# Patient Record
Sex: Male | Born: 1995 | Race: Black or African American | Hispanic: No | Marital: Single | Smoking: Former smoker
Health system: Southern US, Community
[De-identification: ages and names within clinical notes are randomized; demographics above are authoritative.]

---

## 2016-12-30 ENCOUNTER — Other Ambulatory Visit: Payer: Self-pay | Admitting: Family

## 2016-12-30 ENCOUNTER — Inpatient Hospital Stay (HOSPITAL_COMMUNITY)
Admission: AD | Admit: 2016-12-30 | Discharge: 2017-01-03 | DRG: 885 | Disposition: A | Discharge status: Home / Self Care | Payer: No Typology Code available for payment source | Source: Intra-hospital | Attending: Psychiatry | Admitting: Psychiatry

## 2016-12-30 ENCOUNTER — Emergency Department (HOSPITAL_COMMUNITY)
Admission: EM | Admit: 2016-12-30 | Discharge: 2016-12-30 | Disposition: A | Discharge status: Other Acute Inpatient Hospital | Payer: No Typology Code available for payment source | Attending: Emergency Medicine | Admitting: Emergency Medicine

## 2016-12-30 ENCOUNTER — Encounter (HOSPITAL_COMMUNITY): Payer: Self-pay | Admitting: *Deleted

## 2016-12-30 ENCOUNTER — Emergency Department (HOSPITAL_COMMUNITY): Payer: No Typology Code available for payment source

## 2016-12-30 ENCOUNTER — Encounter (HOSPITAL_COMMUNITY): Payer: Self-pay | Admitting: Emergency Medicine

## 2016-12-30 DIAGNOSIS — Y9389 Activity, other specified: Secondary | ICD-10-CM | POA: Insufficient documentation

## 2016-12-30 DIAGNOSIS — F29 Unspecified psychosis not due to a substance or known physiological condition: Secondary | ICD-10-CM | POA: Diagnosis present

## 2016-12-30 DIAGNOSIS — Z5181 Encounter for therapeutic drug level monitoring: Secondary | ICD-10-CM | POA: Diagnosis not present

## 2016-12-30 DIAGNOSIS — T1491XA Suicide attempt, initial encounter: Secondary | ICD-10-CM | POA: Insufficient documentation

## 2016-12-30 DIAGNOSIS — F1610 Hallucinogen abuse, uncomplicated: Secondary | ICD-10-CM | POA: Diagnosis not present

## 2016-12-30 DIAGNOSIS — Y929 Unspecified place or not applicable: Secondary | ICD-10-CM | POA: Diagnosis not present

## 2016-12-30 DIAGNOSIS — F321 Major depressive disorder, single episode, moderate: Secondary | ICD-10-CM | POA: Diagnosis not present

## 2016-12-30 DIAGNOSIS — S1191XA Laceration without foreign body of unspecified part of neck, initial encounter: Secondary | ICD-10-CM | POA: Insufficient documentation

## 2016-12-30 DIAGNOSIS — X781XXA Intentional self-harm by knife, initial encounter: Secondary | ICD-10-CM | POA: Diagnosis not present

## 2016-12-30 DIAGNOSIS — R443 Hallucinations, unspecified: Secondary | ICD-10-CM | POA: Diagnosis not present

## 2016-12-30 DIAGNOSIS — F323 Major depressive disorder, single episode, severe with psychotic features: Principal | ICD-10-CM | POA: Diagnosis present

## 2016-12-30 DIAGNOSIS — F1910 Other psychoactive substance abuse, uncomplicated: Secondary | ICD-10-CM | POA: Diagnosis not present

## 2016-12-30 DIAGNOSIS — Y999 Unspecified external cause status: Secondary | ICD-10-CM | POA: Diagnosis not present

## 2016-12-30 DIAGNOSIS — S41112A Laceration without foreign body of left upper arm, initial encounter: Secondary | ICD-10-CM | POA: Insufficient documentation

## 2016-12-30 DIAGNOSIS — F17210 Nicotine dependence, cigarettes, uncomplicated: Secondary | ICD-10-CM | POA: Diagnosis not present

## 2016-12-30 DIAGNOSIS — F1290 Cannabis use, unspecified, uncomplicated: Secondary | ICD-10-CM | POA: Diagnosis not present

## 2016-12-30 DIAGNOSIS — F17200 Nicotine dependence, unspecified, uncomplicated: Secondary | ICD-10-CM | POA: Insufficient documentation

## 2016-12-30 DIAGNOSIS — F329 Major depressive disorder, single episode, unspecified: Secondary | ICD-10-CM | POA: Diagnosis not present

## 2016-12-30 LAB — CBC
HEMATOCRIT: 41.7 % (ref 39.0–52.0)
HEMOGLOBIN: 13.8 g/dL (ref 13.0–17.0)
MCH: 29.9 pg (ref 26.0–34.0)
MCHC: 33.1 g/dL (ref 30.0–36.0)
MCV: 90.3 fL (ref 78.0–100.0)
Platelets: 231 10*3/uL (ref 150–400)
RBC: 4.62 MIL/uL (ref 4.22–5.81)
RDW: 12.5 % (ref 11.5–15.5)
WBC: 11 10*3/uL — ABNORMAL HIGH (ref 4.0–10.5)

## 2016-12-30 LAB — COMPREHENSIVE METABOLIC PANEL
ALK PHOS: 50 U/L (ref 38–126)
ALT: 29 U/L (ref 17–63)
AST: 34 U/L (ref 15–41)
Albumin: 4.1 g/dL (ref 3.5–5.0)
Anion gap: 9 (ref 5–15)
BILIRUBIN TOTAL: 0.6 mg/dL (ref 0.3–1.2)
BUN: 12 mg/dL (ref 6–20)
CO2: 23 mmol/L (ref 22–32)
Calcium: 9.2 mg/dL (ref 8.9–10.3)
Chloride: 103 mmol/L (ref 101–111)
Creatinine, Ser: 1.34 mg/dL — ABNORMAL HIGH (ref 0.61–1.24)
GFR calc Af Amer: >60 mL/min (ref >60)
GLUCOSE: 132 mg/dL — AB (ref 65–99)
POTASSIUM: 4.1 mmol/L (ref 3.5–5.1)
Sodium: 135 mmol/L (ref 135–145)
Total Protein: 6.8 g/dL (ref 6.5–8.1)

## 2016-12-30 LAB — BPAM RBC
Blood Product Expiration Date: 201807082359
Blood Product Expiration Date: 201807222359
ISSUE DATE / TIME: 201806290627
ISSUE DATE / TIME: 201806290627
UNIT TYPE AND RH: 9500
Unit Type and Rh: 9500

## 2016-12-30 LAB — BPAM FFP
BLOOD PRODUCT EXPIRATION DATE: 201807122359
BLOOD PRODUCT EXPIRATION DATE: 201807192359
ISSUE DATE / TIME: 201806290627
ISSUE DATE / TIME: 201806290627
UNIT TYPE AND RH: 600
Unit Type and Rh: 600

## 2016-12-30 LAB — I-STAT CHEM 8, ED
BUN: 16 mg/dL (ref 6–20)
CREATININE: 1.2 mg/dL (ref 0.61–1.24)
Calcium, Ion: 1.08 mmol/L — ABNORMAL LOW (ref 1.15–1.40)
Chloride: 102 mmol/L (ref 101–111)
Glucose, Bld: 131 mg/dL — ABNORMAL HIGH (ref 65–99)
HEMATOCRIT: 41 % (ref 39.0–52.0)
Hemoglobin: 13.9 g/dL (ref 13.0–17.0)
Potassium: 4.1 mmol/L (ref 3.5–5.1)
Sodium: 139 mmol/L (ref 135–145)
TCO2: 23 mmol/L (ref 0–100)

## 2016-12-30 LAB — TYPE AND SCREEN
ABO/RH(D): A POS
Antibody Screen: NEGATIVE
UNIT DIVISION: 0
Unit division: 0

## 2016-12-30 LAB — I-STAT CG4 LACTIC ACID, ED: Lactic Acid, Venous: 1.14 mmol/L (ref 0.5–1.9)

## 2016-12-30 LAB — PREPARE FRESH FROZEN PLASMA
UNIT DIVISION: 0
Unit division: 0

## 2016-12-30 LAB — URINALYSIS, ROUTINE W REFLEX MICROSCOPIC
GLUCOSE, UA: NEGATIVE mg/dL
Hgb urine dipstick: NEGATIVE
Ketones, ur: 5 mg/dL — AB
LEUKOCYTES UA: NEGATIVE
Nitrite: NEGATIVE
PH: 6 (ref 5.0–8.0)
PROTEIN: NEGATIVE mg/dL
SPECIFIC GRAVITY, URINE: 1.03 (ref 1.005–1.030)

## 2016-12-30 LAB — ABO/RH: ABO/RH(D): A POS

## 2016-12-30 LAB — RAPID URINE DRUG SCREEN, HOSP PERFORMED
AMPHETAMINES: NOT DETECTED
BENZODIAZEPINES: NOT DETECTED
Barbiturates: NOT DETECTED
COCAINE: NOT DETECTED
OPIATES: NOT DETECTED
Tetrahydrocannabinol: POSITIVE — AB

## 2016-12-30 LAB — PROTIME-INR
INR: 1.03
Prothrombin Time: 13.5 seconds (ref 11.4–15.2)

## 2016-12-30 LAB — ETHANOL: Alcohol, Ethyl (B): <5 mg/dL (ref <5)

## 2016-12-30 LAB — ACETAMINOPHEN LEVEL: Acetaminophen (Tylenol), Serum: <10 ug/mL — ABNORMAL LOW (ref 10–30)

## 2016-12-30 LAB — SALICYLATE LEVEL

## 2016-12-30 LAB — CDS SEROLOGY

## 2016-12-30 MED ORDER — LORAZEPAM 2 MG/ML IJ SOLN
0.5000 mg | Freq: Once | INTRAMUSCULAR | Status: AC
  Administered 2016-12-30: 0.5 mg via INTRAVENOUS
  Filled 2016-12-30: qty 1

## 2016-12-30 MED ORDER — ALUM & MAG HYDROXIDE-SIMETH 200-200-20 MG/5ML PO SUSP: 30.0000 mL | ORAL | Status: DC | PRN

## 2016-12-30 MED ORDER — LIDOCAINE-EPINEPHRINE (PF) 2 %-1:200000 IJ SOLN
10.0000 mL | Freq: Once | INTRAMUSCULAR | Status: AC
  Administered 2016-12-30: 10 mL via INTRADERMAL
  Filled 2016-12-30: qty 20

## 2016-12-30 MED ORDER — SODIUM CHLORIDE 0.9 % IV BOLUS (SEPSIS): 500.0000 mL | Freq: Once | INTRAVENOUS | Status: DC

## 2016-12-30 MED ORDER — SODIUM CHLORIDE 0.9 % IV BOLUS (SEPSIS)
1000.0000 mL | Freq: Once | INTRAVENOUS | Status: AC
  Administered 2016-12-30: 1000 mL via INTRAVENOUS

## 2016-12-30 MED ORDER — BACITRACIN-NEOMYCIN-POLYMYXIN 400-5-5000 EX OINT
TOPICAL_OINTMENT | Freq: Once | CUTANEOUS | Status: DC
  Filled 2016-12-30: qty 1

## 2016-12-30 MED ORDER — ACETAMINOPHEN 325 MG PO TABS
650.0000 mg | ORAL_TABLET | Freq: Four times a day (QID) | ORAL | Status: DC | PRN
  Administered 2016-12-30 – 2017-01-01 (×3): 650 mg via ORAL
  Filled 2016-12-30 (×3): qty 2

## 2016-12-30 MED ORDER — MAGNESIUM HYDROXIDE 400 MG/5ML PO SUSP: 30.0000 mL | Freq: Every day | ORAL | Status: DC | PRN

## 2016-12-30 MED ORDER — IOPAMIDOL (ISOVUE-370) INJECTION 76%
50.0000 mL | Freq: Once | INTRAVENOUS | Status: AC | PRN
  Administered 2016-12-30: 50 mL via INTRAVENOUS

## 2016-12-30 MED ORDER — TRAZODONE HCL 50 MG PO TABS
50.0000 mg | ORAL_TABLET | Freq: Every evening | ORAL | Status: DC | PRN
  Administered 2016-12-30 – 2017-01-02 (×4): 50 mg via ORAL
  Filled 2016-12-30 (×4): qty 1

## 2016-12-30 MED ORDER — NEOMYCIN-POLYMYXIN-PRAMOXINE 1 % EX CREA
TOPICAL_CREAM | Freq: Two times a day (BID) | CUTANEOUS | Status: DC
  Filled 2016-12-30: qty 28

## 2016-12-30 NOTE — ED Notes (Signed)
TTS at bedside. 

## 2016-12-30 NOTE — ED Notes (Signed)
Returned from CT scan in stable condition , dressing intact ,IV site unremarkable , Dr. Lindie SpruceWyatt and Dr. Janee Mornhompson at bedside .

## 2016-12-30 NOTE — ED Provider Notes (Signed)
Labs reassuring, lac repaired by trauma and pt medically cleared. [facility]   [place], Ambrose Finlandachel Morgan, MD 12/30/16 1255
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
## 2016-12-30 NOTE — Procedures (Signed)
Consent was obtained for laceration repair of the neck and left forearm.  The patients neck was prepped and draped in the typical sterile fashion. Local anesthesia achieved with 2% lidocaine with epinephrine. The area was copiously irrigated.There was venous oozing from 2 small blood vessels near the platysma muscle, these were ligated with 2, 3-0 Monocryl sutures and hemostasis achieved. The laceration was closed with 7, simple interrupted 4.0 prolene sutures. The patient tolerated the procedure well. Estimated blood loss was < 5 cc. Sutures will require removal in 7-10 days.     The left forearm was prepped and draped in the typical sterile fashion. Local anesthesia achieved using 2% lidocaine with epinephrine. The area was copiously irrigated. The laceration was closed with 8, vertical mattress 4.0 prolene sutures. The patient tolerated the procedure well. Estimated blood loss was none. Sutures will require removal in 7-10 days.       Hosie SpangleElizabeth Analisa Sledd, PA-C [facility] Pager: [phone] Consults: [phone] Mon-Fri 7:00 am-4:30 pm Sat-Sun 7:00 am-11:30 am
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
## 2016-12-30 NOTE — Tx Team (Signed)
Initial Treatment Plan 12/30/2016 2:28 PM Daniel LaurenceJulian L Gaede UEA:[id number]    PATIENT STRESSORS: Educational concerns Substance abuse Traumatic event   PATIENT STRENGTHS: MetallurgistCommunication skills Financial means General fund of knowledge Physical Health   PATIENT IDENTIFIED PROBLEMS: Depression  Suicidal ideation  Substance abuse  Psychosis  "I want the voices to get better"  "I want my moods better"           DISCHARGE CRITERIA:  Improved stabilization in mood, thinking, and/or behavior Verbal commitment to aftercare and medication compliance Withdrawal symptoms are absent or subacute and managed without 24-hour nursing intervention  PRELIMINARY DISCHARGE PLAN: Outpatient therapy Medication management  PATIENT/FAMILY INVOLVEMENT: This treatment plan has been presented to and reviewed with the patient, Daniel Harrington.  The patient and family have been given the opportunity to ask questions and make suggestions.  Levin BaconHeather V Mialee Weyman, RN 12/30/2016, 2:28 PM

## 2016-12-30 NOTE — Progress Notes (Signed)
Admission DAR Note: Pt is a 21 y/o AAM admitted under Voluntary status related to suicide. Pt presents tearful with depressed affect and mood on initial contact. Per pt "I just want the voices to go away and to feel better again". Per pt "I've been hearing voices on and off for a year now, sometimes it's just whispers, the voices told me to kill myself with a knife, I tried to and that's how I ended up here". Endorsed passive SI but verbally contracts for safety. States he's never been diagnosed with or treated for mental illness. 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 States he smokes THC and drinks 6 beers 2-3X/week. States he's been smoking "weed on and off since I was 21 y/o in high school and I started drinking freshman year in college". Denies h/o abuse. Works as a Social workertour guide on campus. Recent stressor is moving into a new house with friends. Skin assessment done, multiple lacerations noted to left arm (5--self inflicted) on left side of neck. Belongings searched as per protocol; no contraband found. Emotional support and availability provided to pt. Unit orientation and routines discussed with pt, understanding verbalized.  Encouraged pt to voice concerns and comply with treatment regimen. Q 15 minutes safety checks maintained without self harm gestures thus far this shift.

## 2016-12-30 NOTE — Discharge Instructions (Signed)
Have stitches removed in 1 week.
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
## 2016-12-30 NOTE — [facility] Counselor (Addendum)
[facility] Accepting provider- Hillery Jacksanika Lewis NP Attending is Dr. Jama Flavorsobos. Report [phone]  Pt can be transported at any time. RN notified and stated he would made EDP aware.   [address]Kristin Keniel Ralston South PottstownLPC, LCAS

## 2016-12-30 NOTE — ED Triage Notes (Signed)
Patient arrived with EMS from home , presents with self inflicted multiple lacerations at left wrist and at left lower neck approx 2" at 1am this morning , pt. stated auditory hallucinations , [facility] reported that pt. ingested unknown amount of Melatonin this morning . Alert and oriented /respirations unlabored at arrival .

## 2016-12-30 NOTE — ED Notes (Signed)
Pt transported to Ct scan.

## 2016-12-31 DIAGNOSIS — R443 Hallucinations, unspecified: Secondary | ICD-10-CM

## 2016-12-31 DIAGNOSIS — F1290 Cannabis use, unspecified, uncomplicated: Secondary | ICD-10-CM

## 2016-12-31 DIAGNOSIS — F321 Major depressive disorder, single episode, moderate: Secondary | ICD-10-CM

## 2016-12-31 DIAGNOSIS — F29 Unspecified psychosis not due to a substance or known physiological condition: Secondary | ICD-10-CM

## 2016-12-31 DIAGNOSIS — F1910 Other psychoactive substance abuse, uncomplicated: Secondary | ICD-10-CM

## 2016-12-31 MED ORDER — SERTRALINE HCL 50 MG PO TABS
50.0000 mg | ORAL_TABLET | Freq: Every day | ORAL | Status: DC
  Administered 2016-12-31 – 2017-01-03 (×4): 50 mg via ORAL
  Filled 2016-12-31 (×8): qty 1

## 2016-12-31 MED ORDER — BACITRACIN-NEOMYCIN-POLYMYXIN OINTMENT TUBE
TOPICAL_OINTMENT | Freq: Two times a day (BID) | CUTANEOUS | Status: DC
  Administered 2016-12-31 (×2): via TOPICAL
  Filled 2016-12-31: qty 14.17
  Filled 2016-12-31: qty 1

## 2016-12-31 MED ORDER — BACITRACIN-NEOMYCIN-POLYMYXIN OINTMENT TUBE
TOPICAL_OINTMENT | Freq: Two times a day (BID) | CUTANEOUS | Status: DC
  Administered 2017-01-01: 1 via TOPICAL
  Administered 2017-01-01 – 2017-01-02 (×2): via TOPICAL
  Administered 2017-01-02: 1 via TOPICAL
  Administered 2017-01-03: 08:00:00 via TOPICAL
  Filled 2016-12-31: qty 14.17

## 2016-12-31 MED ORDER — ARIPIPRAZOLE 2 MG PO TABS
2.0000 mg | ORAL_TABLET | Freq: Every day | ORAL | Status: DC
  Administered 2016-12-31 – 2017-01-03 (×4): 2 mg via ORAL
  Filled 2016-12-31 (×8): qty 1

## 2016-12-31 NOTE — Progress Notes (Signed)
[facility] Group Notes:  (Nursing/MHT/Case Management/Adjunct)  Date:  12/31/2016  Time:  2045  Type of Therapy:  wrap up group  Participation Level:  Active  Participation Quality:  Appropriate  Affect:  Appropriate  Cognitive:  Appropriate  Insight:  Appropriate  Engagement in Group:  Engaged  Modes of Intervention:  Clarification, Education and Support  Summary of Progress/Problems: Pt reported feeling relieved at getting a diagnosis today. Pt shared that now he feels he can have a clear picture of reality. Pt wishes he had never done LSD. Pt plans on discharging to his mothers home in [place] for two weeks and then [place]. Pt shared he is grateful for his family and fraternity brothers.   Marcille BuffyMcNeil, Abbi Mancini S 12/31/2016, 9:43 PM

## 2016-12-31 NOTE — Progress Notes (Signed)
D: Pt denies SI/HI/AVH. Pt is pleasant and cooperative. 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 "I feel better, got diagnosed, I can understand better"  A: Pt was offered support and encouragement. Pt was given scheduled medications. Pt was encourage to attend groups. Q 15 minute checks were done for safety.    R:Pt attends groups and interacts well with peers and staff. Pt is taking medication. Pt has no complaints.Pt receptive to treatment and safety maintained on unit.
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
## 2016-12-31 NOTE — Plan of Care (Signed)
Problem: Safety: Goal: Periods of time without injury will increase Outcome: Progressing Pt safe on the unit at this time   

## 2016-12-31 NOTE — Plan of Care (Signed)
Problem: Activity: Goal: Interest or engagement in activities will improve Outcome: Progressing Pt sen talking / joking with peers in dayroom Goal: Sleeping patterns will improve Outcome: Progressing Pt slept over 6 hrs last night  Problem: Education: Goal: Emotional status will improve Outcome: Progressing Pt stated he felt better after finding out his diagnosis Goal: Mental status will improve Outcome: Progressing Pt stated AH- off and on, but nothing lately Goal: Verbalization of understanding the information provided will improve Outcome: Progressing Pt stated he felt better after doctor explained his diagnosis  Problem: Coping: Goal: Ability to demonstrate self-control will improve Outcome: Progressing Pt has been appropriate on the unit this evening

## 2016-12-31 NOTE — [facility] Group Notes (Signed)
[facility] LCSW Group Therapy Note  01/16/2016 at 10:15 AM  Type of Therapy and Topic:  Group Therapy: Avoiding Self-Sabotaging and Enabling Behaviors  Participation Level:  Active  Participation Quality:  Attentive and Sharing  Affect:  Appropriate  Cognitive:  Appropriate  Insight:  Improving  Engagement in Therapy:  Developing/Improving   Therapeutic models used: Cognitive Behavioral Therapy,  Person-Centered Therapy and Motivational Interviewing  Modes of Intervention:  Discussion, Exploration, Orientation, Rapport Building, Socialization and Support  Summary of Progress/Problems:  The main focus of today's process group was for the patient to identify ways in which they have in the past perhaps sabotaged their own recovery. Motivational Interviewing was utilized to identify what might be a sign they were getting better and what might motivate them to become more engaged with change. Patient shared willingness to be consistent with medications and therapy if anything can help with audio hallucinations   Carney Bernatherine C Harrill, LCSW
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
## 2016-12-31 NOTE — Progress Notes (Signed)
Patient ID: Daniel Harrington, male   DOB: 06/24/1996, 21 y.o.   MRN: [id number]    D: Pt has been very flat and depressed on the unit today. Pt did attend all groups and engaged in treatment. Pt reported that his depression was a 0, his hopelessness was a 7, and his anxiety was a 7. Pt reported that her goal for today was to understand his thoughts. Pt has lacerations to neck and left arm, dressing to arm was changed this morning.  Pt reported being negative SI/HI, no AH/VH noted. A: 15 min checks continued for patient safety. R: Pt safety maintained.

## 2016-12-31 NOTE — [facility] Suicide Risk Assessment (Signed)
[facility]   Nursing information obtained from:    Demographic factors:    Current Mental Status:    Loss Factors:    Historical Factors:    Risk Reduction Factors:     Total Time spent with patient: 1 hour Principal Problem: <principal problem not specified> Diagnosis:   Patient Active Problem List   Diagnosis Date Noted  . Psychotic disorder [F29] 12/30/2016   Subjective Data: alert, oriented, dsyphoric, has stitches around neck due to recent self cuts.   Continued Clinical Symptoms:  Alcohol Use Disorder Identification Test Final Score (AUDIT): 11 The "Alcohol Use Disorders Identification Test", Guidelines for Use in Primary Care, Second Edition.  World [employer] writerHealth Organization [facility](WHO). Score between 0-7:  no or low risk or alcohol related problems. Score between 8-15:  moderate risk of alcohol related problems. Score between 16-19:  high risk of alcohol related problems. Score 20 or above:  warrants further diagnostic evaluation for alcohol dependence and treatment.   CLINICAL FACTORS:   Alcohol/Substance Abuse/Dependencies Currently Psychotic Unstable or Poor Therapeutic Relationship   Musculoskeletal: Strength & Muscle Tone: within normal limits Gait & Station: normal Patient leans: no lean  Psychiatric Specialty Exam: Physical Exam  Constitutional: He appears well-developed.    Review of Systems  Cardiovascular: Negative for chest pain.  Skin: Negative for rash.  Psychiatric/Behavioral: Positive for depression, hallucinations and substance abuse.    Blood pressure 124/67, pulse 89, temperature 98.4 F (36.9 C), temperature source Oral, resp. rate 20, height 6\' 1"  (1.854 m), weight 78 kg (172 lb), SpO2 100 %.Body mass index is 22.69 kg/m.  General Appearance: Casual  Eye Contact:  Fair  Speech:  Slow  Volume:  Normal  Mood:  Dysphoric  Affect:  Depressed  Thought Process:  Goal Directed  Orientation:  Full (Time, Place, and Person)   Thought Content:  Hallucinations: Auditory and Rumination  Suicidal Thoughts:  No  Homicidal Thoughts:  No  Memory:  Immediate;   Fair Recent;   Fair  Judgement:  Poor  Insight:  Shallow  Psychomotor Activity:  Normal  Concentration:  Concentration: Fair and Attention Span: Fair  Recall:  [employer]  Fund of Knowledge:  Fair  Language:  Fair  Akathisia:  Negative  Handed:  Right  AIMS (if indicated):     Assets:  Desire for Improvement  ADL's:  Intact  Cognition:  WNL  Sleep:  Number of Hours: 6.25      COGNITIVE FEATURES THAT CONTRIBUTE TO RISK:  Closed-mindedness and Polarized thinking    SUICIDE RISK:   Severe:  Frequent, intense, and enduring suicidal ideation, specific plan, no subjective intent, but some objective markers of intent (i.e., choice of lethal method), the method is accessible, some limited preparatory behavior, evidence of impaired self-control, severe dysphoria/symptomatology, multiple risk factors present, and few if any protective factors, particularly a lack of social support.  PLAN OF CARE: Admit for stabilization. Medication management and safety.   I certify that inpatient services furnished can reasonably be expected to improve the patient's condition.   Thresa RossAKHTAR, Merrell Borsuk, MD 12/31/2016, 10:38 AM
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
## 2017-01-01 NOTE — Progress Notes (Signed)
Patient ID: Daniel Harrington, male   DOB: 05/18/1996, 21 y.o.   MRN: [id number]  DAR: Pt. Denies SI/HI and visual Hallucinations. He does endorse auditory hallucinations. Reporting, "they are better. It's more like they come about if I think about them." He reports sleep is good, appetite is fair, energy level is low, and concentration is poor. He rates depression 0/10, hopelessness 2/10, and anxiety 2/10. He reports minimal pain in his neck area and received PRN Tylenol. His lacerations on his left arm appear with scant amount of serosanguineous drainage. Dressing was changed on patient's left arm twice today and patient tolerated well. Patient's laceration on his left neck area appears with intact sutures and no apparent drainage. Scheduled antibiotic ointment applied to patient's neck. Other scheduled medication administered to patient per physician's orders. Patient is seen in the milieu minimally, often isolating to his room. He is seen more in the milieu as the day progresses. He reports this evening that he feels as though he is "tired" as a side effect of his medication. Otherwise, he feels like he is getting better. His plan of care continues. Q15 minute checks are maintained for safety.

## 2017-01-01 NOTE — Progress Notes (Signed)
[facility] MD Progress Note  01/01/2017 12:29 PM Daniel Harrington  MRN:  [id number] Subjective:  Alert oriented, cooperative.  As per initial assessment " Daniel Harrington an 21 y.o.malewho came to the ED by EMS after he cut his throat and arm with a knife in a suicide attempt. Wounds were deep enough to need sutures. Pt states that he lives [place] fr[place] house and has been experiencing a lot of stress over the past year. Onset of symptoms started In March 2017 after his girlfriend (ex now) attempted suicide by overdosing and he found her and had to call the ambulance. After that they broke up and he used LSD for the first time to cope with what he had experienced. He states that when he used the hallucinogen he started hearing voices telling him to kill himself or he would have to kill other people. The voices told him that the only way to stop "the loop" is for him to die. Pt states that he has heard voices on and off over the past year. The voices are only "loud" when he is using either marijuana or LSD (has used it one other time 6 months ago). Pt uses marijuana 1-2 times a month. He states that he still hears voices when even when he is not using but they are more "whispers" than loud talking."  On evaluation today feels relief that his voices would have been by drug use or LSD. Feels less paranoid, more clear. Denies hallucinations.  Tolerating medications No intent to harm Wounds healing   Principal Problem: <principal problem not specified> Diagnosis:   Patient Active Problem List   Diagnosis Date Noted  . 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 No pertinent past medical history. History reviewed. No pertinent surgical history. Family History: History reviewed. No pertinent family history. Family Psychiatric  History: see chart Social History:  History  Alcohol Use No     History  Drug Use No     Social History   Social History  . Marital status: Single    Spouse name: N/A  . Number of children: N/A  . Years of education: N/A   Social History Main Topics  . Smoking status: Current Some Day Smoker  . Smokeless tobacco: Never Used  . Alcohol use No  . Drug use: No  . Sexual activity: Not Asked   Other Topics Concern  . None   Social History Narrative  . None   Additional Social History:                         Sleep: Fair  Appetite:  Fair  Current Medications: Current Facility-Administered Medications  Medication Dose Route Frequency Provider Last Rate Last Dose  . acetaminophen (TYLENOL) tablet 650 mg  650 mg Oral Q6H PRN Oneta Rack, NP   650 mg at 01/01/17 0809  . alum & mag hydroxide-simeth (MAALOX/MYLANTA) 200-200-20 MG/5ML suspension 30 mL  30 mL Oral Q4H PRN Oneta Rack, NP      . ARIPiprazole (ABILIFY) tablet 2 mg  2 mg Oral Daily Thresa Ross, MD   2 mg at 01/01/17 0807  . magnesium hydroxide (MILK OF MAGNESIA) suspension 30 mL  30 mL Oral Daily PRN Oneta Rack, NP      . neomycin-bacitracin-polymyxin (NEOSPORIN) ointment   Topical BID Jackelyn Poling, NP      .  sertraline (ZOLOFT) tablet 50 mg  50 mg Oral Daily Thresa RossAkhtar, Kaiven Vester, MD   50 mg at 01/01/17 0807  . traZODone (DESYREL) tablet 50 mg  50 mg Oral QHS PRN Oneta RackLewis, Tanika N, NP   50 mg at 12/31/16 2128    Lab Results: No results found for this or any previous visit (from the past 48 hour(s)).  Blood Alcohol level:  Lab Results  Component Value Date   ETH <5 12/30/2016    Metabolic Disorder Labs: No results found for: HGBA1C, MPG No results found for: PROLACTIN No results found for: CHOL, TRIG, HDL, CHOLHDL, VLDL, LDLCALC  Physical Findings: AIMS: Facial and Oral Movements Muscles of Facial Expression: None, normal Lips and Perioral Area: None, normal Jaw: None, normal Tongue: None, normal,Extremity Movements Upper (arms, wrists, hands, fingers): None, normal Lower  (legs, knees, ankles, toes): None, normal, Trunk Movements Neck, shoulders, hips: None, normal, Overall Severity Severity of abnormal movements (highest score from questions above): None, normal Incapacitation due to abnormal movements: None, normal Patient's awareness of abnormal movements (rate only patient's report): No Awareness, Dental Status Current problems with teeth and/or dentures?: No Does patient usually wear dentures?: No  CIWA:    COWS:     Musculoskeletal: Strength & Muscle Tone: within normal limits Gait & Station: normal Patient leans: no lean  Psychiatric Specialty Exam: Physical Exam  Review of Systems  Cardiovascular: Negative for chest pain.  Skin: Negative for rash.  Psychiatric/Behavioral: The patient is nervous/anxious.     Blood pressure 132/76, pulse 80, temperature 98.1 F (36.7 C), temperature source Oral, resp. rate 16, height 6\' 1"  (1.854 m), weight 78 kg (172 lb), SpO2 100 %.Body mass index is 22.69 kg/m.  General Appearance: Casual  Eye Contact:  Fair  Speech:  Normal Rate  Volume:  Normal  Mood:  Dysphoric  Affect:  Congruent  Thought Process:  Goal Directed  Orientation:  Full (Time, Place, and Person)  Thought Content:  Rumination  Suicidal Thoughts:  No  Homicidal Thoughts:  No  Memory:  Immediate;   Fair Recent;   Fair  Judgement:  Fair  Insight:  Fair  Psychomotor Activity:  Normal  Concentration:  Concentration: Fair and Attention Span: Fair  Recall:  [employer]Fair  Fund of Knowledge:  Fair  Language:  Fair  Akathisia:  Negative  Handed:  Right  AIMS (if indicated):     Assets:  Desire for Improvement  ADL's:  Intact  Cognition:  WNL  Sleep:  Number of Hours: 6.75     Treatment Plan Summary: Daily contact with patient to assess and evaluate symptoms and progress in treatment   Psychosis unspecified may be related to LSD/marijuana or drug use: resolving  Will keep current treatment plan.    Thresa RossAKHTAR, Jazzman Loughmiller, MD 01/01/2017, 12:29  PM

## 2017-01-01 NOTE — Progress Notes (Signed)
D: Pt +ve AH denies SI/HI/VH. Pt is pleasant and cooperative. Pt stated he had a good and continues to progress. Pt seen talking with peers/ friends during visitation hrs.  A: Pt was offered support and encouragement. Pt was given scheduled medications. Pt was encourage to attend groups. Q 15 minute checks were done for safety.   R:Pt attends groups and interacts well with peers and staff. Pt is taking medication. Pt has no complaints at this time .Pt receptive to treatment and safety maintained on unit.

## 2017-01-01 NOTE — [facility] Group Notes (Signed)
[facility] Group Notes:  Healthy Support Systems   Date:  01/01/2017  Time:  4:14 PM  Type of Therapy:  Psychoeducational Skills  Participation Level:  Did Not Attend  Participation Quality:  N/A  Affect:  N/A  Cognitive:  N/A  Insight:  None  Engagement in Group:  None  Modes of Intervention:  Discussion and Education  Summary of Progress/Problems: Patient was invited but did not attend.   Delana Manganello E 01/01/2017, 4:14 PM 

## 2017-01-01 NOTE — [facility] Group Notes (Signed)
[facility] LCSW Group Therapy  01/01/2017   Type of Therapy:  Group Therapy  Participation Level:  Active  Participation Quality:  Appropriate and Attentive  Affect:  Appropriate  Cognitive:  Alert and Oriented  Insight:  Improving  Engagement in Therapy:  Improving  Modes of Intervention:  Discussion  Today's group was about using different tools to prepare for successful discharge. Facilitator identified three major areas to review. One was supports at discharge. Another area was utilizing treatment planning and services. Finally identifying self-care goals in order to have clear directive for ongoing progress. Patients were able to engage well and identify how to develop these key tools for a good discharge. Patient engaged little with discussion, but added to context by requesting that we include gaining understanding of mental health diagnosis and treatment. Patient encouraged to discuss details with prescriber and therapist for specific and individualized treatment plan needs.   Beverly Sessionsywan J Monseratt Ledin MSW, LCSW

## 2017-01-01 NOTE — Plan of Care (Signed)
Problem: Safety: Goal: Periods of time without injury will increase Outcome: Progressing Pt safe on the unit at this time  Problem: Coping: Goal: Ability to cope will improve Outcome: Progressing Pt stated he has bee feeling a lot better since learning his diagnosis

## 2017-01-01 NOTE — Progress Notes (Signed)
Patient did attend the evening speaker AA meeting.  

## 2017-01-02 ENCOUNTER — Encounter (HOSPITAL_COMMUNITY): Payer: Self-pay | Admitting: Behavioral Health

## 2017-01-02 DIAGNOSIS — F329 Major depressive disorder, single episode, unspecified: Secondary | ICD-10-CM

## 2017-01-02 DIAGNOSIS — F1610 Hallucinogen abuse, uncomplicated: Secondary | ICD-10-CM | POA: Diagnosis present

## 2017-01-02 DIAGNOSIS — F17210 Nicotine dependence, cigarettes, uncomplicated: Secondary | ICD-10-CM

## 2017-01-02 DIAGNOSIS — F323 Major depressive disorder, single episode, severe with psychotic features: Secondary | ICD-10-CM | POA: Clinically undetermined

## 2017-01-02 NOTE — [facility] Group Notes (Signed)
Pt attended spiritual care group on grief and loss facilitated by chaplain Norma Montemurro   Group opened with brief discussion and psycho-social ed around grief and loss in relationships and in relation to self - identifying life patterns, circumstances, changes that cause losses. Established group norm of speaking from own life experience. Group goal of establishing open and affirming space for members to share loss and experience with grief, normalize grief experience and provide psycho social education and grief support.     

## 2017-01-02 NOTE — Progress Notes (Signed)
[facility] MD Progress Note  01/02/2017 12:38 PM NEZIAH BRALEY  MRN:  [id number]  Subjective:  " I was finally diagnosed and it made me feel much better understanding what I have."  On evaluation: Face to face evaluation completed and chart reviewed. During this evaluation, patient alert and oriented x4, calm,and cooperative. Patient denies depressive symptoms at this time. He endorses relief that he has now been diagnosed. He denies hearing voices today. Denies VH and does not appear internally preoccupied. Reports improvement in paranoid ideations. Reports some over sedation with administration of Abilify. Reports Zoloft is well tolerated and without side effects. Endorses good appetite and sleeping pattern. Remains complaint with therapeutic milieu and no behavioral issues have been reported or observed. Continues to show progress in wound healing. At this time, he is able to contract for safety on the unit.    Principal Problem: <principal problem not specified> Diagnosis:   Patient Active Problem List   Diagnosis Date Noted  . 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 No pertinent past medical history. History reviewed. No pertinent surgical history. Family History: History reviewed. No pertinent family history. Family Psychiatric  History: see chart Social History:  History  Alcohol Use No     History  Drug Use No    Social History   Social History  . Marital status: Single    Spouse name: N/A  . Number of children: N/A  . Years of education: N/A   Social History Main Topics  . Smoking status: Current Some Day Smoker  . Smokeless tobacco: Never Used  . Alcohol use No  . Drug use: No  . Sexual activity: Not Asked   Other Topics Concern  . None   Social History Narrative  . None   Additional Social History:    Sleep: Good  Appetite:  Good  Current Medications: Current  Facility-Administered Medications  Medication Dose Route Frequency Provider Last Rate Last Dose  . acetaminophen (TYLENOL) tablet 650 mg  650 mg Oral Q6H PRN Oneta Rack, NP   650 mg at 01/01/17 0809  . alum & mag hydroxide-simeth (MAALOX/MYLANTA) 200-200-20 MG/5ML suspension 30 mL  30 mL Oral Q4H PRN Oneta Rack, NP      . ARIPiprazole (ABILIFY) tablet 2 mg  2 mg Oral Daily Thresa Ross, MD   2 mg at 01/02/17 0755  . magnesium hydroxide (MILK OF MAGNESIA) suspension 30 mL  30 mL Oral Daily PRN Oneta Rack, NP      . neomycin-bacitracin-polymyxin (NEOSPORIN) ointment   Topical BID Nira Conn A, NP      . sertraline (ZOLOFT) tablet 50 mg  50 mg Oral Daily Thresa Ross, MD   50 mg at 01/02/17 0755  . traZODone (DESYREL) tablet 50 mg  50 mg Oral QHS PRN Oneta Rack, NP   50 mg at 01/01/17 2135    Lab Results: No results found for this or any previous visit (from the past 48 hour(s)).  Blood Alcohol level:  Lab Results  Component Value Date   ETH <5 12/30/2016    Metabolic Disorder Labs: No results found for: HGBA1C, MPG No results found for: PROLACTIN No results found for: CHOL, TRIG, HDL, CHOLHDL, VLDL, LDLCALC  Physical Findings: AIMS: Facial and Oral Movements Muscles of Facial Expression: None, normal Lips and Perioral Area: None, normal Jaw: None, normal Tongue: None, normal,Extremity Movements Upper (arms, wrists, hands, fingers): None,  normal Lower (legs, knees, ankles, toes): None, normal, Trunk Movements Neck, shoulders, hips: None, normal, Overall Severity Severity of abnormal movements (highest score from questions above): None, normal Incapacitation due to abnormal movements: None, normal Patient's awareness of abnormal movements (rate only patient's report): No Awareness, Dental Status Current problems with teeth and/or dentures?: No Does patient usually wear dentures?: No  CIWA:    COWS:     Musculoskeletal: Strength & Muscle Tone: within  normal limits Gait & Station: normal Patient leans: no lean  Psychiatric Specialty Exam: Physical Exam  Nursing note and vitals reviewed. Constitutional: He is oriented to person, place, and time.  Neurological: He is alert and oriented to person, place, and time.    Review of Systems  Cardiovascular: Negative for chest pain.  Skin: Negative for rash.  Psychiatric/Behavioral: Negative for depression, hallucinations, memory loss, substance abuse and suicidal ideas. The patient is nervous/anxious. The patient does not have insomnia.   All other systems reviewed and are negative.   Blood pressure 123/66, pulse (!) 101, temperature 99.2 F (37.3 C), temperature source Oral, resp. rate 16, height 6\' 1"  (1.854 m), weight 172 lb (78 kg), SpO2 100 %.Body mass index is 22.69 kg/m.  General Appearance: Casual  Eye Contact:  Fair  Speech:  Normal Rate  Volume:  Normal  Mood:  Euthymic  Affect:  Appropriate  Thought Process:  Goal Directed  Orientation:  Full (Time, Place, and Person)  Thought Content:  Logical denies AVH, preoccupations, ruminations   Suicidal Thoughts:  No  Homicidal Thoughts:  No  Memory:  Immediate;   Fair Recent;   Fair  Judgement:  Fair  Insight:  Fair  Psychomotor Activity:  Normal  Concentration:  Concentration: Fair and Attention Span: Fair  Recall:  [employer]Fair  Fund of Knowledge:  Fair  Language:  Fair  Akathisia:  Negative  Handed:  Right  AIMS (if indicated):     Assets:  Desire for Improvement  ADL's:  Intact  Cognition:  WNL  Sleep:  Number of Hours: 6.5     Treatment Plan Summary: Daily contact with patient to assess and evaluate symptoms and progress in treatment   Reviewed current treatment plan and will continue the following without adjustments;   Psychosis unspecified may be related to LSD/marijuana or drug use: resolving. Will continue Abilify 2 mg po daily.  Depression-Improving. Will contiue Zoloft 50 mg po daily for depression management.    Will adjust medications as appropriate. Patient twill continue to participate in therapeutic milieu. Will continue 15 minute safety checks.      Denzil MagnusonLaShunda Johneisha Broaden, NP 01/02/2017, 12:38 PM   Patient ID: Reather LaurenceJulian L Lindner, male   DOB: 02/22/1996, 20 y.o.   MRN: [id number]

## 2017-01-02 NOTE — Progress Notes (Signed)
Patient ID: [name], male   DOB: 09/15/1995, 21 y.o.   MRN: [id number]  DAR: Pt. Denies SI/HI and A/V Hallucinations. He reports sleep is good, appetite is fair, energy level is normal, and concentration is good. He rates depression 0/10, hopelessness 1/10, and anxiety 0/10. Patient does report minimal pain in neck laceration area but refuses intervention at this time. He reports, "I feel good." Support and encouragement provided to the patient. Scheduled medications administered to patient per physician's orders. Patient's bandage on left arm was changed, the lacerations were cleansed with normal saline and ointment was applied. Patient reports some itching but otherwise reports his lacerations are doing well. Minimal drainage was noted on patient's bandage. His neck laceration sutures appear intact. It is open to air and no drainage noted. Patient is receptive and cooperative. He is seen in the milieu intermittently. Q15 minute checks are maintained for safety.

## 2017-01-02 NOTE — [facility] Group Notes (Signed)
[facility]  01/02/2017 8:45 AM  Participation Quality: Alert, Appropriate and Oriented  Mood/Affect: Appropriate  Plan for Discharge/Comments: Pt attended discharge planning group and actively participated in group. CSW discussed suicide prevention education with the group and encouraged them to discuss discharge planning and any relevant barriers. Pt discussed desire to discharge soon. [facility]   Vernie ShanksLauren Reilly Molchan, LCSW 01/02/2017 10:21 AM

## 2017-01-02 NOTE — [facility] Suicide Risk Assessment (Signed)
[facility] INPATIENT:  Family/Significant Other Suicide Prevention Education  Suicide Prevention Education:  Education Completed; Rodena PietyJody Moes, Pt's mother [phone], has been identified by the patient as the family member/significant other with whom the patient will be residing, and identified as the person(s) who will aid the patient in the event of a mental health crisis (suicidal ideations/suicide attempt).  With written consent from the patient, the family member/significant other has been provided the following suicide prevention education, prior to the and/or following the discharge of the patient.  The suicide prevention education provided includes the following:  Suicide risk factors  Suicide prevention and interventions  National Suicide Hotline telephone number  [facility] telephone number  [facility] Assistance 911  [facility] and/or [facility] Crisis [facility] telephone number  Request made of family/significant other to:  Remove weapons (e.g., guns, rifles, knives), all items previously/currently identified as safety concern.    Remove drugs/medications (over-the-counter, prescriptions, illicit drugs), all items previously/currently identified as a safety concern.  The family member/significant other verbalizes understanding of the suicide prevention education information provided.  The family member/significant other agrees to remove the items of safety concern listed above.  Pt's mother was tearful during call due to being concerned about Pt. Mother is supportive and would like Pt to come to [place] for the summer. She feels that PHP would be a good discharge plan.    Verdene LennertLauren C Zyere Jiminez 01/02/2017, 10:23 AM

## 2017-01-02 NOTE — [facility] Group Notes (Signed)
[facility] LCSW Group Therapy  01/02/2017 1:15pm  Type of Therapy:  Group Therapy vercoming Obstacles  Participation Level:  Active  Participation Quality:  Appropriate   Affect:  Appropriate  Cognitive:  Appropriate and Oriented  Insight:  Developing/Improving and Improving  Engagement in Therapy:  Improving  Modes of Intervention:  Discussion, Exploration, Problem-solving and Support  Description of Group:   In this group patients will be encouraged to explore what they see as obstacles to their own wellness and recovery. They will be guided to discuss their thoughts, feelings, and behaviors related to these obstacles. The group will process together ways to cope with barriers, with attention given to specific choices patients can make. Each patient will be challenged to identify changes they are motivated to make in order to overcome their obstacles. This group will be process-oriented, with patients participating in exploration of their own experiences as well as giving and receiving support and challenge from other group members.  Summary of Patient Progress: Pt participated appropriately and identified that explaining his mental illness is a primary obstacle returning to campus. Pt expresses that he would like to be more open with friends and family about his family in order to deal with his emotions more effectively.   Therapeutic Modalities:   Cognitive Behavioral Therapy Solution Focused Therapy Motivational Interviewing Relapse Prevention Therapy   Vernie ShanksLauren [place], LCSW 01/02/2017 3:30 PM

## 2017-01-02 NOTE — Tx Team (Signed)
Interdisciplinary Treatment and Diagnostic Plan Update  01/02/2017 Time of Session: 9:30am Daniel LaurenceJulian L Harrington MRN: [id number]  Principal Diagnosis:  unspecified psychotic disorder  Secondary Diagnoses: Active Problems:   Psychotic disorder   Current Medications:  Current Facility-Administered Medications  Medication Dose Route Frequency Provider Last Rate Last Dose  . acetaminophen (TYLENOL) tablet 650 mg  650 mg Oral Q6H PRN Oneta RackLewis, Tanika N, NP   650 mg at 01/01/17 0809  . alum & mag hydroxide-simeth (MAALOX/MYLANTA) 200-200-20 MG/5ML suspension 30 mL  30 mL Oral Q4H PRN Oneta RackLewis, Tanika N, NP      . ARIPiprazole (ABILIFY) tablet 2 mg  2 mg Oral Daily Thresa RossAkhtar, Nadeem, MD   2 mg at 01/02/17 0755  . magnesium hydroxide (MILK OF MAGNESIA) suspension 30 mL  30 mL Oral Daily PRN Oneta RackLewis, Tanika N, NP      . neomycin-bacitracin-polymyxin (NEOSPORIN) ointment   Topical BID Nira ConnBerry, Jason A, NP      . sertraline (ZOLOFT) tablet 50 mg  50 mg Oral Daily Thresa RossAkhtar, Nadeem, MD   50 mg at 01/02/17 0755  . traZODone (DESYREL) tablet 50 mg  50 mg Oral QHS PRN Oneta RackLewis, Tanika N, NP   50 mg at 01/01/17 2135    PTA Medications: Prescriptions Prior to Admission  Medication Sig Dispense Refill Last Dose  . CREATINE PO Take 1 capsule by mouth daily.   Past Month at Unknown time  . MELATONIN PO Take 1 capsule by mouth daily.   12/30/2016 at Unknown time  . Multiple Vitamin (MULTIVITAMIN) tablet Take 1 tablet by mouth daily.   Past Month at Unknown time  . Omega-3 Fatty Acids (FISH OIL PO) Take 1 capsule by mouth daily.   Past Month at Unknown time    Treatment Modalities: Medication Management, Group therapy, Case management,  1 to 1 session with clinician, Psychoeducation, Recreational therapy.  Patient Stressors: Educational concerns Substance abuse Traumatic event  Patient Strengths: MetallurgistCommunication skills Financial means General fund of knowledge Physical Health  Physician Treatment Plan for Primary  Diagnosis:  unspecified psychotic disorder Long Term Goal(s): Improvement in symptoms so as ready for discharge  Short Term Goals: Ability to verbalize feelings will improve Ability to demonstrate self-control will improve Ability to identify and develop effective coping behaviors will improve Ability to identify changes in lifestyle to reduce recurrence of condition will improve Ability to disclose and discuss suicidal ideas Ability to maintain clinical measurements within normal limits will improve  Medication Management: Evaluate patient's response, side effects, and tolerance of medication regimen.  Therapeutic Interventions: 1 to 1 sessions, Unit Group sessions and Medication administration.  Evaluation of Outcomes: Progressing  Physician Treatment Plan for Secondary Diagnosis: Active Problems:   Psychotic disorder   Long Term Goal(s): Improvement in symptoms so as ready for discharge  Short Term Goals: Ability to verbalize feelings will improve Ability to demonstrate self-control will improve Ability to identify and develop effective coping behaviors will improve Ability to identify changes in lifestyle to reduce recurrence of condition will improve Ability to disclose and discuss suicidal ideas Ability to maintain clinical measurements within normal limits will improve  Medication Management: Evaluate patient's response, side effects, and tolerance of medication regimen.  Therapeutic Interventions: 1 to 1 sessions, Unit Group sessions and Medication administration.  Evaluation of Outcomes: Progressing   RN Treatment Plan for Primary Diagnosis:  unspecified psychotic disorder Long Term Goal(s): Knowledge of disease and therapeutic regimen to maintain health will improve  Short Term Goals: Ability to verbalize feelings will  improve, Ability to disclose and discuss suicidal ideas and Ability to identify and develop effective coping behaviors will improve  Medication  Management: RN will administer medications as ordered by provider, will assess and evaluate patient's response and provide education to patient for prescribed medication. RN will report any adverse and/or side effects to prescribing provider.  Therapeutic Interventions: 1 on 1 counseling sessions, Psychoeducation, Medication administration, Evaluate responses to treatment, Monitor vital signs and CBGs as ordered, Perform/monitor CIWA, COWS, AIMS and Fall Risk screenings as ordered, Perform wound care treatments as ordered.  Evaluation of Outcomes: Progressing   LCSW Treatment Plan for Primary Diagnosis:  unspecified psychotic disorder Long Term Goal(s): Safe transition to appropriate next level of care at discharge, Engage patient in therapeutic group addressing interpersonal concerns.  Short Term Goals: Engage patient in aftercare planning with referrals and resources, Identify triggers associated with mental health/substance abuse issues and Increase skills for wellness and recovery  Therapeutic Interventions: Assess for all discharge needs, 1 to 1 time with Social worker, Explore available resources and support systems, Assess for adequacy in community support network, Educate family and significant other(s) on suicide prevention, Complete Psychosocial Assessment, Interpersonal group therapy.  Evaluation of Outcomes: Progressing   Progress in Treatment: Attending groups: Yes Participating in groups: Yes  Taking medication as prescribed: Yes, MD continues to assess for medication changes as needed Toleration medication: Yes, no side effects reported at this time Family/Significant other contact made: No, CSW attempting to make contact with mother Patient understands diagnosis: Continuing to assess Discussing patient identified problems/goals with staff: Yes Medical problems stabilized or resolved: Yes Denies suicidal/homicidal ideation: Yes Issues/concerns per patient self-inventory:  None Other: N/A  New problem(s) identified: None identified at this time.   New Short Term/Long Term Goal(s): None identified at this time.   Discharge Plan or Barriers: Pt will return home and follow-up with outpatient services. Pt is agreeable to referral for PHP  Reason for Continuation of Hospitalization: Anxiety Depression Medication stabilization  Estimated Length of Stay: 1-2 days; Est DC date 7/3  Attendees: Patient: 01/02/2017  9:53 AM  Physician: Dr. Elna Breslow 01/02/2017  9:53 AM  Nursing: Marzetta Board, Joslyn Devon, RN 01/02/2017  9:53 AM  RN Care Manager: Onnie Boer, RN 01/02/2017  9:53 AM  Social Worker: Vernie Shanks, LCSW; Donnelly Stager, LCSWA; Graf Smart, LCSW 01/02/2017  9:53 AM  Recreational Therapist:  01/02/2017  9:53 AM  Other: Armandina Stammer, NP; Garlan Fillers, NP 01/02/2017  9:53 AM  Other:  01/02/2017  9:53 AM  Other: 01/02/2017  9:53 AM    Scribe for Treatment Team: Verdene Lennert, LCSW 01/02/2017 9:53 AM

## 2017-01-03 ENCOUNTER — Other Ambulatory Visit (HOSPITAL_COMMUNITY): Payer: Self-pay | Admitting: Psychiatry

## 2017-01-03 DIAGNOSIS — F1610 Hallucinogen abuse, uncomplicated: Secondary | ICD-10-CM

## 2017-01-03 DIAGNOSIS — F323 Major depressive disorder, single episode, severe with psychotic features: Principal | ICD-10-CM

## 2017-01-03 MED ORDER — SERTRALINE HCL 50 MG PO TABS: 50.0000 mg | ORAL_TABLET | Freq: Every day | ORAL | 0 refills | Status: DC

## 2017-01-03 MED ORDER — TRAZODONE HCL 50 MG PO TABS: 50.0000 mg | ORAL_TABLET | Freq: Every evening | ORAL | 0 refills | Status: DC | PRN

## 2017-01-03 MED ORDER — ARIPIPRAZOLE 2 MG PO TABS: 2.0000 mg | ORAL_TABLET | Freq: Every day | ORAL | 0 refills | Status: DC

## 2017-01-03 NOTE — Progress Notes (Signed)
D: Pt at the time of assessment denied SI, HI, anxiety, depression, pain or AVH; "I am didn't cut myself because I was depressed or anything like that, I did it because the voice in my head said so. I feel better now and I think my medications are working." Pt observed interacting with peers in the dayroom. Pt was pleasant and cooperative.  A: Medications offered as prescribed. All patient's questions and concerns addressed. Support, encouragement, and safe environment provided. 15-minute safety checks continue. R: Pt was med compliant. Pt attended wrap-up group. Safety checks continue.

## 2017-01-03 NOTE — Tx Team (Signed)
Interdisciplinary Treatment and Diagnostic Plan Update  01/03/2017 Time of Session: 9:30am Daniel Harrington MRN: [id number]  Principal Diagnosis:  unspecified psychotic disorder  Secondary Diagnoses: Principal Problem:   Major depressive disorder, single episode, severe with psychotic features [facility]) Active Problems:   Hallucinogen abuse   Current Medications:  Current Facility-Administered Medications  Medication Dose Route Frequency Provider Last Rate Last Dose  . acetaminophen (TYLENOL) tablet 650 mg  650 mg Oral Q6H PRN Derrill Center, NP   650 mg at 01/01/17 0809  . alum & mag hydroxide-simeth (MAALOX/MYLANTA) 200-200-20 MG/5ML suspension 30 mL  30 mL Oral Q4H PRN Derrill Center, NP      . ARIPiprazole (ABILIFY) tablet 2 mg  2 mg Oral Daily Merian Capron, MD   2 mg at 01/03/17 0758  . magnesium hydroxide (MILK OF MAGNESIA) suspension 30 mL  30 mL Oral Daily PRN Derrill Center, NP      . neomycin-bacitracin-polymyxin (NEOSPORIN) ointment   Topical BID Lindon Romp A, NP      . sertraline (ZOLOFT) tablet 50 mg  50 mg Oral Daily Merian Capron, MD   50 mg at 01/03/17 0758  . traZODone (DESYREL) tablet 50 mg  50 mg Oral QHS PRN Derrill Center, NP   50 mg at 01/02/17 2109    PTA Medications: Prescriptions Prior to Admission  Medication Sig Dispense Refill Last Dose  . CREATINE PO Take 1 capsule by mouth daily.   Past Month at Unknown time  . MELATONIN PO Take 1 capsule by mouth daily.   12/30/2016 at Unknown time  . Multiple Vitamin (MULTIVITAMIN) tablet Take 1 tablet by mouth daily.   Past Month at Unknown time  . Omega-3 Fatty Acids (FISH OIL PO) Take 1 capsule by mouth daily.   Past Month at Unknown time    Treatment Modalities: Medication Management, Group therapy, Case management,  1 to 1 session with clinician, Psychoeducation, Recreational therapy.  Patient Stressors: Educational concerns Substance abuse Traumatic event  Patient Strengths: Horticulturist, commercial fund of knowledge Physical Health  Physician Treatment Plan for Primary Diagnosis:  unspecified psychotic disorder Long Term Goal(s): Improvement in symptoms so as ready for discharge  Short Term Goals: Ability to verbalize feelings will improve Ability to demonstrate self-control will improve Ability to identify and develop effective coping behaviors will improve Ability to identify changes in lifestyle to reduce recurrence of condition will improve Ability to disclose and discuss suicidal ideas Ability to maintain clinical measurements within normal limits will improve  Medication Management: Evaluate patient's response, side effects, and tolerance of medication regimen.  Therapeutic Interventions: 1 to 1 sessions, Unit Group sessions and Medication administration.  Evaluation of Outcomes: Met  Physician Treatment Plan for Secondary Diagnosis: Principal Problem:   Major depressive disorder, single episode, severe with psychotic features ([place]) Active Problems:   Hallucinogen abuse   Long Term Goal(s): Improvement in symptoms so as ready for discharge  Short Term Goals: Ability to verbalize feelings will improve Ability to demonstrate self-control will improve Ability to identify and develop effective coping behaviors will improve Ability to identify changes in lifestyle to reduce recurrence of condition will improve Ability to disclose and discuss suicidal ideas Ability to maintain clinical measurements within normal limits will improve  Medication Management: Evaluate patient's response, side effects, and tolerance of medication regimen.  Therapeutic Interventions: 1 to 1 sessions, Unit Group sessions and Medication administration.  Evaluation of Outcomes: Met   RN Treatment Plan for Primary  Diagnosis:  unspecified psychotic disorder Long Term Goal(s): Knowledge of disease and therapeutic regimen to maintain health will improve  Short  Term Goals: Ability to verbalize feelings will improve, Ability to disclose and discuss suicidal ideas and Ability to identify and develop effective coping behaviors will improve  Medication Management: RN will administer medications as ordered by provider, will assess and evaluate patient's response and provide education to patient for prescribed medication. RN will report any adverse and/or side effects to prescribing provider.  Therapeutic Interventions: 1 on 1 counseling sessions, Psychoeducation, Medication administration, Evaluate responses to treatment, Monitor vital signs and CBGs as ordered, Perform/monitor CIWA, COWS, AIMS and Fall Risk screenings as ordered, Perform wound care treatments as ordered.  Evaluation of Outcomes: Met  LCSW Treatment Plan for Primary Diagnosis:  unspecified psychotic disorder Long Term Goal(s): Safe transition to appropriate next level of care at discharge, Engage patient in therapeutic group addressing interpersonal concerns.  Short Term Goals: Engage patient in aftercare planning with referrals and resources, Identify triggers associated with mental health/substance abuse issues and Increase skills for wellness and recovery  Therapeutic Interventions: Assess for all discharge needs, 1 to 1 time with Social worker, Explore available resources and support systems, Assess for adequacy in community support network, Educate family and significant other(s) on suicide prevention, Complete Psychosocial Assessment, Interpersonal group therapy.  Evaluation of Outcomes: Met   Progress in Treatment: Attending groups: Yes Participating in groups: Yes  Taking medication as prescribed: Yes,  Toleration medication: Yes, n Family/Significant other contact made:SPE completed with pt's mother. Patient understands diagnosis: Continuing to assess Discussing patient identified problems/goals with staff: Yes Medical problems stabilized or resolved: Yes Denies  suicidal/homicidal ideation: Yes Issues/concerns per patient self-inventory: None Other: N/A  New problem(s) identified: None identified at this time.   New Short Term/Long Term Goal(s): None identified at this time.   Discharge Plan or Barriers: Pt to return home; follow up at [facility] PCP and [employer].   Reason for Continuation of Hospitalization: none  Estimated Length of Stay:t DC date 7/3  Attendees: Patient: 01/03/2017  8:34 AM  Physician: Dr. Dwyane Dee MD 01/03/2017  8:34 AM  Nursing: Curly Shores RN 01/03/2017  8:34 AM  RN Care Manager: Lars Pinks, RN 01/03/2017  8:34 AM  Social Worker:Madyx Delfin Smart, LCSW 01/03/2017  8:34 AM  Recreational Therapist:  01/03/2017  8:34 AM  Other: Lindell Spar, NP; Tinnie Gens, NP; Heloise Purpura NP 01/03/2017  8:34 AM  Other:  01/03/2017  8:34 AM  Other: 01/03/2017  8:34 AM    Scribe for Treatment Team: Maxie Better, MSW, LCSW Clinical Social Worker 01/03/2017 10:26 AM

## 2017-01-03 NOTE — [facility] Group Notes (Signed)
[facility] Group Notes:  (Nursing/MHT/Case Management/Adjunct)  Date:  01/03/2017  Time:  0902  Type of Therapy:  Nurse Education  Participation Level:  Active  Participation Quality:  Appropriate  Affect:  Appropriate  Cognitive:  Appropriate  Insight:  Appropriate  Engagement in Group:  Engaged  Modes of Intervention:  Discussion, Education and Support  Summary of Progress/Problems: Discussed SMART goal-setting and appropriate interaction on unit. Pt said his goal is to communicate more and "keep my headspace clean."  Maurine SimmeringShugart, Amous Crewe M 01/03/2017, 10:12 AM

## 2017-01-03 NOTE — Progress Notes (Signed)
  [facility] :  Will you be returning to the same living situation after discharge:  Yes,  home At discharge, do you have transportation home?: Yes,  parent Do you have the ability to pay for your medications: Yes,  [employer]  Release of information consent forms completed and submitted to medical records by CSW.  Patient to Follow up at: Follow-up Information    BEHAVIORAL HEALTH PARTIAL HOSPITALIZATION PROGRAM Follow up on 01/09/2017.   Specialty:  Behavioral Health Why:  at 2:30pm for your initial assessment for the partial hopsital program. Please arrive at 1:30pm to complete new patient paperwork. Contact information: [address] [id number] [place]c [place] [id number] [phone]       [facility] Follow up on 01/09/2017.   Why:  at 3:45pm for an assessment for the partial hopsital program. Please bring insurance information and photo ID Contact information: [address] [place], [place] [address] Phone: [phone]  Fax: [phone]           Next level of care provider has [facility] Link: no.   Safety Planning and Suicide Prevention discussed: Yes,  SPE completed with both pt and his mother. SPI pamphlet and Mobile Crisis information provided.   Have you used any form of tobacco in the last 30 days? (Cigarettes, Smokeless Tobacco, Cigars, and/or Pipes): No ("I just smoke weed")  Has patient been referred to the Quitline?: Patient refused referral  Patient has been referred for addiction treatment: Yes  Mercer Peifer N Smart LCSW 01/03/2017, 8:32 AM

## 2017-01-03 NOTE — Progress Notes (Signed)
Patient was discharged per order. AVS, medications, scripts, and transition summary were all reviewed with patient. Pt was given an opportunity to ask questions and verbalized understanding of all discharge paperwork. Belongings were returned, and patient signed for receipt. Patient verbalized readiness for discharge and appeared in no acute distress when escorted to lobby where ride awaited.

## 2017-01-03 NOTE — [facility] Suicide Risk Assessment (Signed)
[facility]   Principal Problem: Major depressive disorder, single episode, severe with psychotic features [facility](HCC) Discharge Diagnoses:  Patient Active Problem List   Diagnosis Date Noted  . Major depressive disorder, single episode, severe with psychotic features (HCC) [F32.3] 01/02/2017  . 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 He reports he is taking an online class. Patient denies any suicidal ideation, homicidal ideation, any psychotic symptoms. He has that his mood is improved, states that he made a poor choice and has no plans of hurting himself again. Total Time spent with patient: 30 minutes  Musculoskeletal: Strength & Muscle Tone: within normal limits Gait & Station: normal Patient leans: N/A  Psychiatric Specialty Exam: Review of Systems  Constitutional: Negative.  Negative for fever and malaise/fatigue.  HENT: Negative.  Negative for congestion and sore throat.   Eyes: Negative.  Negative for double vision, discharge and redness.  Respiratory: Negative.  Negative for cough and wheezing.   Cardiovascular: Negative.  Negative for chest pain and palpitations.  Gastrointestinal: Negative.  Negative for abdominal pain, diarrhea, heartburn, nausea and vomiting.  Genitourinary: Negative.   Musculoskeletal: Negative.  Negative for falls and myalgias.  Neurological: Negative.  Negative for dizziness, seizures, loss of consciousness, weakness and headaches.  Endo/Heme/Allergies: Negative.  Negative for environmental allergies.  Psychiatric/Behavioral: Positive  for substance abuse. Negative for depression, hallucinations and suicidal ideas. The patient is not nervous/anxious and does not have insomnia.     Blood pressure 120/71, pulse (!) 109, temperature 98.6 F (37 C), temperature source Oral, resp. rate 18, height 6\' 1"  (1.854 m), weight 78 kg (172 lb), SpO2 100 %.Body mass index is 22.69 kg/m.  General Appearance: Casual  Eye Contact::  Fair  Speech:  Clear and Coherent and Normal Rate409  Volume:  Normal  Mood:  Euthymic  Affect:  Congruent and Full Range  Thought Process:  Coherent, Goal Directed and Descriptions of Associations: Intact  Orientation:  Full (Time, Place, and Person)  Thought Content:  WDL  Suicidal Thoughts:  No  Homicidal Thoughts:  No  Memory:  Immediate;   Fair Recent;   Fair Remote;   Fair  Judgement:  Intact  Insight:  Present  Psychomotor Activity:  Normal  Concentration:  Fair  Recall:  FiservFair  Fund of Knowledge:Fair  Language: Fair  Akathisia:  No  Handed:  Right  AIMS (if indicated):     Assets:  Engineer, maintenanceCommunication Skills Housing Physical Health Social Support  Sleep:  Number of Hours: 6.75  Cognition: WNL  ADL's:  Intact   Mental Status Per Nursing Assessment::   On Admission:     Demographic Factors:  Male and Adolescent or young adult  Loss Factors: NA  Historical Factors: Impulsivity  Risk Reduction Factors:   Sense of responsibility to family and Positive social support  Continued Clinical Symptoms:  Alcohol/Substance Abuse/Dependencies  Cognitive Features That Contribute To Risk:  None    Suicide Risk:  Minimal: No identifiable suicidal ideation.  Patients presenting with no risk factors but with morbid ruminations; may be classified as minimal risk based on the  severity of the depressive symptoms  Follow-up Information    BEHAVIORAL HEALTH PARTIAL HOSPITALIZATION PROGRAM Follow up on 01/09/2017.   Specialty:  Behavioral Health Why:  at 2:30pm for your initial assessment for the  partial hopsital program. Please arrive at 1:30pm to complete new patient paperwork. Contact information: [address] [id number] mc [place] [address] [phone]       [facility] Follow up on 01/09/2017.   Why:  at 3:45pm for an assessment for the partial hopsital program. Please bring insurance information and photo ID Contact information: [address] [place], [place] [address] Phone: [phone]  Fax: [phone]           Plan Of Care/Follow-up recommendations:  Activity:  As tolerated Diet:  Regular Other:  Keep follow-up appointments   Nelly Rout, MD 01/03/2017, 1:09 PM
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
## 2017-01-09 ENCOUNTER — Other Ambulatory Visit (HOSPITAL_COMMUNITY): Payer: PRIVATE HEALTH INSURANCE | Attending: Psychiatry | Admitting: Licensed Clinical Social Worker

## 2017-01-09 DIAGNOSIS — F322 Major depressive disorder, single episode, severe without psychotic features: Secondary | ICD-10-CM

## 2017-01-09 NOTE — Psych (Signed)
[name] is a [place]20yo male who was referred from inpatient for a CCA for PHP.  Pt was admitted to inpatient after cutting his throat and neck.  Pt stated he had smoked pot that evening and started hearing voices that told him to kill himself so he could "get back to reality."  Pt stated he cut himself, fell asleep, and then woke up and called 911.  Pt stated he had not experienced hearing voices prior to this experience and has not experienced hearing voices since being on medication.  Pt states he used LSD for the first time in April 2017 and again in December 2017.  He said he had a "bad trip" each time.  Patient states he smoked pot for the first time at [age] and then "off and on" since then.  Pt states he will not smoke again, and didn't really enjoy it, because he is anxious about having another "episode."  Pt states he continues to drink, but has not since "episode."  Pt states he is not experiencing any depression or anxiety symptoms at the moment.  Pt stated he was not aware that PHP was a 5 hour a day group and did not want to commit to that.  Pt reported he wants to explore himself and talk to a one-on-one counselor.  Patient is not high risk.  Cln got patient a one-on-one session with Dorann LodgeWes Swan on 7/10 at 2pm and Dr. Rene KocherEksir on 8/16 at 10am.

## 2017-01-10 ENCOUNTER — Ambulatory Visit (INDEPENDENT_AMBULATORY_CARE_PROVIDER_SITE_OTHER): Payer: PRIVATE HEALTH INSURANCE | Admitting: Licensed Clinical Social Worker

## 2017-01-10 DIAGNOSIS — F322 Major depressive disorder, single episode, severe without psychotic features: Secondary | ICD-10-CM

## 2017-01-11 ENCOUNTER — Encounter (HOSPITAL_COMMUNITY): Payer: Self-pay | Admitting: Licensed Clinical Social Worker

## 2017-01-11 ENCOUNTER — Ambulatory Visit (HOSPITAL_COMMUNITY): Payer: Self-pay | Admitting: Licensed Clinical Social Worker

## 2017-01-11 NOTE — Procedures (Addendum)
Consent was obtained for laceration repair of the neck and left forearm.  The patients neck was prepped and draped in the typical sterile fashion. Local anesthesia achieved with 2% lidocaine with epinephrine. The area was copiously irrigated.There was venous oozing from 2 small blood vessels near the platysma muscle, these were ligated with 2, 3-0 Monocryl sutures and hemostasis achieved. The laceration was closed with 7, simple interrupted 4.0 prolene sutures. The patient tolerated the procedure well. Estimated blood loss was < 5 cc. Sutures will require removal in 7-10 days.   Laceration was 8 cm long, simple repair of both the meck and wrist lacerations.  This patient has been seen and I agree with the findings and treatment plan.  Marta LamasJames O. Gae BonWyatt, III, MD, FACS [phone] (pager) [phone] (direct pager) Trauma Surgeon
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
## 2017-01-24 ENCOUNTER — Ambulatory Visit (INDEPENDENT_AMBULATORY_CARE_PROVIDER_SITE_OTHER): Payer: PRIVATE HEALTH INSURANCE | Admitting: Licensed Clinical Social Worker

## 2017-01-24 DIAGNOSIS — F323 Major depressive disorder, single episode, severe with psychotic features: Secondary | ICD-10-CM | POA: Diagnosis not present

## 2017-01-25 ENCOUNTER — Encounter (HOSPITAL_COMMUNITY): Payer: Self-pay | Admitting: Licensed Clinical Social Worker

## 2017-01-25 NOTE — Progress Notes (Signed)
   THERAPIST PROGRESS NOTE  Session Time: 2-4pm  Participation Level: Active  Behavioral Response: Neat and Well GroomedAlertEuthymic  Type of Therapy: Individual Therapy  Treatment Goals addressed: Coping  Interventions: CBT, Strength-based, Supportive and Reframing  Summary: Daniel Harrington is a 21 y.o. male who presents [facility] for depression w/ psychosis. Pt repots "a lot has happened" since session last week. Pt reports he drank 3 times since last session and feels somewhat guilty since "this is wrong and I should have talked to my doctor before drinking". He states he has also had some symptoms of his psychosis returning w/o any hallucinogen use. Pt describes imagining "another place", a place he describes as another world where there is no suffering but also no excitement or fun. Pt believes he is on a mission to save the kids in this world, and allow them to escape to be a part of the "real world". When asked on a scale of 1-10 how sure he is this is true, pt replied "right now as we talk a 2, but when I'm alone at home, I can work myself into a 4 (10 being absolute belief it is true). Pt states he is worried and recognizes this is "not normal" but also states he "wants to do something about it before it gets worse". He sees this symptom as something to manage. He denies any family hx of psychosis or schizophrenia. Counselor and pt discuss pt's childhood in which he had an active imagination. He reports he would "dream up other worlds all the time". Pt reports he "always felt he was special" and sometimes he feels he is on "a special mission" from an unknown source. Near end of session, pt requested to play a song that he believes is telling him a hidden message about the "other place". Pt denies SI or HI currently. Pt asked counselor to fill out and fax a form confirming his counseling session to be sent to [employer] dean of Students office.  Suicidal/Homicidal:  Nowithout intent/plan  Therapist Response: Pt appears to be making progress, he is interested in getting medication for psychosis and desires a "Better understanding of himself". Counselor discussed the connection b/w pt's desire to "fix his ex girlfriend, help his little brothers, and save the imaginary children of the other world".   Plan: Return again in 2 weeks.  Diagnosis:    ICD-10-CM   1. Current severe episode of major depressive disorder with psychotic features without prior episode [facility](HCC) F32.3        Margo CommonWesley E Izel Hochberg, LPCA 01/25/2017

## 2017-01-28 ENCOUNTER — Other Ambulatory Visit (HOSPITAL_COMMUNITY): Payer: Self-pay | Admitting: Psychiatry

## 2017-02-06 ENCOUNTER — Ambulatory Visit (INDEPENDENT_AMBULATORY_CARE_PROVIDER_SITE_OTHER): Payer: PRIVATE HEALTH INSURANCE | Admitting: Licensed Clinical Social Worker

## 2017-02-06 DIAGNOSIS — F323 Major depressive disorder, single episode, severe with psychotic features: Secondary | ICD-10-CM | POA: Diagnosis not present

## 2017-02-07 ENCOUNTER — Encounter (HOSPITAL_COMMUNITY): Payer: Self-pay | Admitting: Licensed Clinical Social Worker

## 2017-02-07 NOTE — Progress Notes (Signed)
   THERAPIST PROGRESS NOTE  Session Time: 11-11:45am  Participation Level: Active  Behavioral Response: Casual and Well GroomedAlertEuthymic  Type of Therapy: Individual Therapy  Treatment Goals addressed: Anxiety and Coping  Interventions: CBT, Strength-based, Supportive and Reframing  Summary: Daniel Harrington is a 21 y.o. male who presents with mixed anxiety and depression and a recent release [facility] for SI and self harm to neck. He reports no thoughts of wanting to hurt himself since last session. He reports medications appear to be working well and he is looking forward to his upcoming meeting w/ Dr. Rene KocherEksir. 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 He verbalizes understanding of creating better boundaries w/ his time and responsibilities. He appears calm and engaged in session. Pt brought a letter from his ex girlfriend (who he has a restraining order on) and asked counselor to read it. Pt admitted he appreciated the letter and did not harbour any resentment towards her.   Suicidal/Homicidal: Nowithout intent/plan  Therapist Response: Counselor discussed and challenged pt irrational beliefs about his fictional place that he imagines "another world". Pt was happy to report he was feeling "more normal and getting ready for school to start". Counselor encouraged pt to discuss his upcoming plans to practice good self care and complete the semester successfully.   Plan: Return again in 2 weeks.  Diagnosis:    ICD-10-CM   1. 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

## 2017-02-16 ENCOUNTER — Ambulatory Visit (INDEPENDENT_AMBULATORY_CARE_PROVIDER_SITE_OTHER): Payer: PRIVATE HEALTH INSURANCE | Admitting: Psychiatry

## 2017-02-16 ENCOUNTER — Encounter (HOSPITAL_COMMUNITY): Payer: Self-pay | Admitting: Psychiatry

## 2017-02-16 VITALS — BP 118/74 | HR 95 | Ht 73.0 in | Wt 172.0 lb

## 2017-02-16 DIAGNOSIS — F323 Major depressive disorder, single episode, severe with psychotic features: Secondary | ICD-10-CM | POA: Diagnosis not present

## 2017-02-16 DIAGNOSIS — Z87891 Personal history of nicotine dependence: Secondary | ICD-10-CM | POA: Diagnosis not present

## 2017-02-16 DIAGNOSIS — F1990 Other psychoactive substance use, unspecified, uncomplicated: Secondary | ICD-10-CM

## 2017-02-16 DIAGNOSIS — F1290 Cannabis use, unspecified, uncomplicated: Secondary | ICD-10-CM

## 2017-02-16 DIAGNOSIS — G4700 Insomnia, unspecified: Secondary | ICD-10-CM

## 2017-02-16 DIAGNOSIS — F419 Anxiety disorder, unspecified: Secondary | ICD-10-CM | POA: Diagnosis not present

## 2017-02-16 DIAGNOSIS — F1610 Hallucinogen abuse, uncomplicated: Secondary | ICD-10-CM | POA: Diagnosis not present

## 2017-02-16 MED ORDER — ARIPIPRAZOLE 2 MG PO TABS: 2.0000 mg | ORAL_TABLET | Freq: Every day | ORAL | 0 refills | Status: DC

## 2017-02-16 MED ORDER — SERTRALINE HCL 100 MG PO TABS: 100.0000 mg | ORAL_TABLET | Freq: Every day | ORAL | 1 refills | Status: DC

## 2017-02-16 MED ORDER — TRAZODONE HCL 50 MG PO TABS: 50.0000 mg | ORAL_TABLET | Freq: Every evening | ORAL | 1 refills | Status: DC | PRN

## 2017-02-16 NOTE — Progress Notes (Signed)
Psychiatric Initial Adult Assessment   Patient Identification: Daniel LaurenceJulian L Deal MRN:  [id number] Date of Evaluation:  02/16/2017 [facility]erral Source[facility] therapist, [facility], [facility] Chief Complaint:   Chief Complaint    Hallucinations    psychosis Visit Diagnosis:    ICD-10-CM   1. 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  Since discharge he has been actively engaged in individual therapy with Gerri Spore[facility], I reviewed these notes prior to our interaction. It appears that he has continued to struggle with some anxiety, magical thinking at times, and has also been thoughtfully considering his future goals.  The patient will be starting senior year at [employer] this fall.   In discussing with patient, he reports that things are been going much better since he was out of the hospital. He reports that he has had one episode of increased anxiety and agitation, where he felt more disconnected and delusional. He reports that he has these depersonalization episodes when he has been feeling more stressed. He reports that he ran out of Abilify and Zoloft about one week ago, and he suspects that this contributed to his mood being more anxious and irritable. He reports that Abilify and Zoloft agree with him, and he takes them every day. He is agreeable to restart Abilify 2 mg, and restart Zoloft at 50 mg, and up titrate to 100 mg for a more robust maintenance dose.  Expressed that ideally I would like to see him off of Abilify in the coming months, and Zoloft 100 mg will make it easier to taper or discontinue Abilify.  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 He reports that he used to want to be a film major, but he realized economics was more practical for him. His parents are both and finances as well. He reports that he is close with his family, but does remember the trauma and anxiety associated with his parents divorced when he was age 21. He reports that he is pretty close with his 1814 and 21 year old brothers.    Regarding mood, he reports that he is feeling okay, but definitely felt better on the Zoloft and Abilify. He denies any suicidality or unsafe thoughts. He reports that he finds that he is able to enjoy his hobbies and activities. He tends to like being involved in school government and Development worker, communityfraternity organization. 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 He reports that his sleep is good and he periodically uses trazodone if he is feeling more restless. He denies any difficulties with concentration or appetite. He denies any auditory or visual hallucinations. He denies any drug use since discharge from the hospital, and reports that he periodically drinks alcohol, typically beer. Spent time encouraging him to limit alcohol use, and especially reflecting on the negative ramifications of hallucinogenic use in  his particular circumstances and presentation.  Associated Signs/Symptoms: Depression Symptoms:  insomnia, anxiety, (Hypo) Manic Symptoms:  none Anxiety Symptoms:  Excessive Worry, Psychotic Symptoms:  Ideas of Reference, PTSD Symptoms: Negative  Past Psychiatric History: Inpatient hospitalization at [facility] June 2018 for suicide attempt/cutting to his throat and arm  in the setting of substance abuse.  Injuries required sutures. He has had AH association with substance use at the time, and was using LSD and THC. Was started on Abilify 2 mg and Zoloft 50 mg daily, trazodone for sleep.  Previous Psychotropic Medications: No   Substance Abuse History in the last 12 months:  Yes.    Consequences of Substance Abuse: as above  Past Medical History: History reviewed. No pertinent past medical history. History reviewed. No pertinent surgical history.  Family Psychiatric History: Reports that his family tends to avoid mental health providers, so he is not sure if they have ever been diagnosed with mental health issues  Family History: History reviewed. No pertinent family history.  Social History:   Social History   Social History  . Marital status: Single    Spouse name: N/A  . Number of children: N/A  . Years of education: N/A   Social History Main Topics  . Smoking status: Former Smoker    Types: Cigarettes    Quit date: 09/01/2016  . Smokeless tobacco: Never Used  . Alcohol use 3.6 oz/week    6 Cans of beer per week  . Drug use: Yes    Types: Marijuana, LSD, Cocaine, Amphetamines     Comment: Last Marijuana 12/28/16 and no other use in over a year  . Sexual activity: No   Other Topics Concern  . None   Social History Narrative  . None    Additional Social History: senior at [employer]  Allergies:  No Known Allergies  Metabolic Disorder Labs: No results found for: HGBA1C, MPG No results found for: PROLACTIN No results found for: CHOL, TRIG, HDL, CHOLHDL, VLDL, LDLCALC   Current Medications: Current Outpatient Prescriptions  Medication Sig Dispense Refill  . ARIPiprazole (ABILIFY) 2 MG tablet Take 1 tablet (2 mg total) by mouth daily. 90 tablet 0  . Multiple Vitamins-Minerals (MEGA MULTIVITAMIN FOR MEN PO) Take by mouth daily.    . Nutritional Supplements (CREATINE) 750 MG CAPS Take by mouth daily.    . Omega-3 Fatty Acids  (FISH OIL PO) Take 1 capsule by mouth daily.    . sertraline (ZOLOFT) 100 MG tablet Take 1 tablet (100 mg total) by mouth daily. Take 1/2 tablet for 1 week, then increase to whole tablet 90 tablet 1  . traZODone (DESYREL) 50 MG tablet Take 1 tablet (50 mg total) by mouth at bedtime as needed. for sleep 90 tablet 1   No current facility-administered medications for this visit.     Neurologic: Headache: Negative Seizure: Negative Paresthesias:Negative  Musculoskeletal: Strength & Muscle Tone: within normal limits Gait & Station: normal Patient leans: N/A  Psychiatric Specialty Exam: Review of Systems  Constitutional: Negative.   HENT: Negative.   Respiratory: Negative.   Cardiovascular: Negative.   Gastrointestinal: Negative.   Musculoskeletal: Negative.   Neurological: Negative.   Psychiatric/Behavioral: Positive for depression. Negative for hallucinations, memory loss, substance abuse and suicidal ideas. The patient is nervous/anxious and has insomnia.     Blood pressure 118/74, pulse 95, height 6\' 1"  (1.854 m), weight 172 lb (78 kg), SpO2 100 %.Body mass index is 22.69 kg/m.  General Appearance: Casual and Fairly Groomed  Eye Contact:  Good  Speech:  Clear and Coherent  Volume:  Normal  Mood:  Anxious and Euthymic  Affect:  Appropriate and Congruent  Thought Process:  Goal Directed  Orientation:  Full (Time, Place, and Person)  Thought Content:  Logical  Suicidal Thoughts:  No  Homicidal Thoughts:  No  Memory:  Immediate;   Good  Judgement:  Good  Insight:  Good  Psychomotor Activity:  Normal  Concentration:  Attention Span: Good  Recall:  Good  Fund of Knowledge:Good  Language: Good  Akathisia:  Negative  Handed:  Right  AIMS (if indicated):  0  Assets:  Communication Skills Desire for Improvement Financial Resources/Insurance Housing Intimacy Leisure Time Physical Health Resilience Social Support Talents/Skills Transportation Vocational/Educational   ADL's:  Intact  Cognition: WNL  Sleep:  7-9 hrs    Treatment Plan Summary: IBRAHEM VOLKMAN is 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  He is actively engaged in individual therapy with Gerri Spore in this office.  He ran out of his medications about a week ago, and has noted increased anxiety and mood symptoms, but feels that therapy has helped him to be able to cope with this. We agreed to restart his medications as below, given the benefits noted with pharmacotherapy. He does not have any acute safety issues or suicidality. He does not present as psychotic or thought disordered, and I suspect that much of his delusional thinking and psychotic symptoms were related to substance abuse. Will proceed as below for treatment of major depressive disorder with anxious features and follow-up in 3 months.  1. 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 Hallucinogen abuse    Restart Zoloft 50 mg, and increase to 100 mg daily in 1 week Restart Abilify 2 mg daily; goal will be to limit the dose and length of time he is on this medication, I'm hopeful we will be able to discontinue in the coming months Continue trazodone 50 mg nightly as needed for sleep Continue individual therapy with Gerri Spore Follow-up with this writer in 3 months or sooner if needed Continue to encourage abstinence from hallucinogenic substances and cannabis  Burnard Leigh, MD 8/16/201810:52 AM

## 2017-02-24 ENCOUNTER — Encounter (HOSPITAL_COMMUNITY): Payer: Self-pay | Admitting: Licensed Clinical Social Worker

## 2017-02-24 ENCOUNTER — Ambulatory Visit (INDEPENDENT_AMBULATORY_CARE_PROVIDER_SITE_OTHER): Payer: PRIVATE HEALTH INSURANCE | Admitting: Licensed Clinical Social Worker

## 2017-02-24 DIAGNOSIS — F323 Major depressive disorder, single episode, severe with psychotic features: Secondary | ICD-10-CM

## 2017-02-24 NOTE — Progress Notes (Signed)
   THERAPIST PROGRESS NOTE  Session Time: 10-11  Participation Level: Active  Behavioral Response: Neat and Well GroomedAlertEuthymic  Type of Therapy: Individual Therapy  Treatment Goals addressed: Diagnosis: MDD w/ psychotic features  Interventions: CBT, Strength-based, Supportive and Reframing  Summary: Daniel Harrington is a 21 y.o. male who presents for help w/ his depression, stress, and worry related to his anxiety about his depression. Pt reports he has had an overall positive 2 weeks but had some challenges w/ his ex girlfriend who showed up at his bday party and "ruined the night" by causing him stress and trying to "get w/ pt". Pt then reported he had a "mini-panic attack" a few days ago when he awoke from a nightmare and began shaking and feeling "very keyed up". Counselor invited pt to "do something different this session" instead of explaining his symptoms. Counselor helped pt to describe and stay w/ his emotional experience of "the weight of the world" on his shoulders. Pt admitted he "often feels like Atlas" and counselor invited pt to described "what he is holding".  Pt stated his "dealing w/ MI helps others from having to deal w/ MI". Counselor spent time helping pt re-label his "delusions as unhelpful thoughts" and avoid calling himself "crazy". Pt verbalized understanding and admitted he realizes that his "having depression does not and cannot keep others from experiencing depression" though he is "frequently tempted to believe this". Counselor encouraged pt to label these kinds of thoughts as "unhelpful" and distract himself by completing an "tangibly helpful act towards himself or someone else".   Suicidal/Homicidal: Nowithout intent/plan  Therapist Response: Counselor encouraged pt to stay w/ his immediate emotional experience and utilized immediacy to help pt challenge his irrational thoughts of depersonalization.   Plan: Return again in 1 weeks.  Diagnosis:    ICD-10-CM    1. 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) F32.3       Margo Common, LCAS-A 02/24/2017

## 2017-03-03 ENCOUNTER — Encounter (HOSPITAL_COMMUNITY): Payer: Self-pay | Admitting: Licensed Clinical Social Worker

## 2017-03-03 ENCOUNTER — Ambulatory Visit (INDEPENDENT_AMBULATORY_CARE_PROVIDER_SITE_OTHER): Payer: No Typology Code available for payment source | Admitting: Licensed Clinical Social Worker

## 2017-03-03 DIAGNOSIS — F323 Major depressive disorder, single episode, severe with psychotic features: Secondary | ICD-10-CM | POA: Diagnosis not present

## 2017-03-03 NOTE — Progress Notes (Signed)
   THERAPIST PROGRESS NOTE  Session Time: 11-12  Participation Level: Active  Behavioral Response: Casual and Well GroomedAlertEuthymic  Type of Therapy: Individual Therapy  Treatment Goals addressed: Diagnosis: MDD  Interventions: CBT, Supportive and Other: Mindfulness based CBT  Summary: Daniel LaurenceJulian L Harrington is a 21 y.o. male who presents for help w/ his depression, worry, and stress. He reports he is "now has a girlfriend, which is weird". Counselor and pt discuss how pt feels about new relationship. Pt admits he feels worried because he is unsure of the expectations on him and has a hx of bad relationships w/ his previous ex-GF. Pt reports his current GF is "healthy and has good boundaries". Pt then begins discussing the pressure he feels to "perform at school and help others in his life". Counselor uses metaphor of a "man in a stadium running a track" and asks pt to explore ways that the metaphor represents pt's life. Pt agrees and states he feels uncomfortable with "not doing things". Counselor asks pt to spend 1 min in mindful meditation. Pt complies and states the beginning he was thinking about a lot of things, but towards the end he only thought of his breath (as instructed). Pt and counselor continue to explore pt's relationship to his inner self and the ways he feels disconnected from any "inner peace".    Pt reports he is utilizing a skill of "labeling unhelpful thoughts' which is contributing to a reduction in his depression. Pt admits he likes feeling symptoms relief but that he is also interested in "discovering more about the origins of his worry and tension within himself".   Suicidal/Homicidal: Nowithout intent/plan  Therapist Response: Counselor used supportive, open questions and MbCBT to allow pt to explore his inner self. Counselor used "here-and-now" reflections to utilize the therapeutic relationship to highlight pt's experience of therapy.   Plan: Return again in 2  weeks.  Diagnosis:    ICD-10-CM   1. 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

## 2017-03-21 ENCOUNTER — Encounter (HOSPITAL_COMMUNITY): Payer: Self-pay | Admitting: Licensed Clinical Social Worker

## 2017-03-21 ENCOUNTER — Ambulatory Visit (INDEPENDENT_AMBULATORY_CARE_PROVIDER_SITE_OTHER): Payer: PRIVATE HEALTH INSURANCE | Admitting: Licensed Clinical Social Worker

## 2017-03-21 DIAGNOSIS — F323 Major depressive disorder, single episode, severe with psychotic features: Secondary | ICD-10-CM

## 2017-03-21 NOTE — Progress Notes (Signed)
   THERAPIST PROGRESS NOTE  Session Time: 3-4pm  Participation Level: Active  Behavioral Response: Casual and Well GroomedAlertEuthymic  Type of Therapy: Individual Therapy  Treatment Goals addressed: Anxiety and Diagnosis: MDD  Interventions: CBT, Strength-based, Supportive and Reframing  Summary: Daniel Harrington is a 21 y.o. male who presents w/ frustration and stress related to a negative reaction to his psychotropic medications. Pt reports he has recently started dating a new girl and when he tries to initiate sex, he cannot get an erection. Pt reports this has never been an issue before the medication. Counselor normalizes the reaction and validates pt's embarrassment and anger that he is struggling w/ this issue. Counselor encourages pt to explore his feelings when he is made aware of this "flaw" in his "otherwise perfect world". Pt admits he feels "terrible" when he has to admit he has flaws. Counselor asks pt to consider pros and cons about this mentality w/ pt identifies.   Pt reports he frequently asks his gf "what she is thinking" as a way to know "how he should treat her"; specifically, pt states "if she's doing bad, I know to make it better, and if she's doing good, I know how to make it great". Counselor pointed out that pt is treating the relationship like work, since he identifies constant ways to improve. Pt and counselor discuss the pros and cons of this mentality. Pt leaves session stating he wants to "ask her how she is feeling less" and "enjoy the relationship more".   Counselor asks about pt's Substance use. Pt reports he has 1 drinking episode per week and does not have more than 2-3 beers per episode. He denies any marijuana use since we last met.   Counselor signs a document that pt presents validating his counseling session and his tx progress for [employer].   Suicidal/Homicidal: Nowithout intent/plan  Therapist Response: Counselor uses open questions and CBT to help pt  identify his thought process, pros and cons, and irrational beliefs.   Plan: Return again in 2 weeks.  Diagnosis:    ICD-10-CM   1. 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

## 2017-04-11 ENCOUNTER — Ambulatory Visit (INDEPENDENT_AMBULATORY_CARE_PROVIDER_SITE_OTHER): Payer: PRIVATE HEALTH INSURANCE | Admitting: Licensed Clinical Social Worker

## 2017-04-11 ENCOUNTER — Encounter (HOSPITAL_COMMUNITY): Payer: Self-pay | Admitting: Licensed Clinical Social Worker

## 2017-04-11 DIAGNOSIS — F323 Major depressive disorder, single episode, severe with psychotic features: Secondary | ICD-10-CM

## 2017-04-11 NOTE — Progress Notes (Signed)
   THERAPIST PROGRESS NOTE  Session Time: 11-12  Participation Level: Active  Behavioral Response: Neat and Well GroomedAlertIrritable  Type of Therapy: Individual Therapy  Treatment Goals addressed: Diagnosis: MDD  Interventions: CBT, Strength-based and Reframing  Summary: Daniel Harrington is a 21 y.o. male who presents with MDD symptoms of irritability, perfectionistic thinking, and irrational beliefs. He reports he has noticed his mood is more down today and "this makes him very frustrated w/ himself". Pt and counselor discuss the activities and busy schedule that pt is maintaining including work, studying, Museum/gallery exhibitions officer, and having a girlfriend. Counselor asks pt to describe his irritability and connect pt to his emotional experience in session. Pt becomes more guarded and intellectual, and appears to resists dropping into his emotions, though his face seems tense and eyes become slightly reddened.    Pt is pensive and thoughtful during session and appears to be contemptlating his irrational beliefs related to "being perfect or being a failure". Counselor points out the "all-or-nothing" nature of this. Counselor asks pt to imagine a friend was discussing this topic and how pt would react to friend. Pt agrees this is "too high of a standard" and he needs to find more ways to "celebrate himself when he accomplishes something". Pt admits that he has never felt like an accomplishment is "worth celebrating bc winning should be enough of a reward".  Pt discusses his relationship to his father as a child and how his dad would instruct him "not to cry" after a loss at soccer. Pt states he "hates to cry" no matter the situation now bc of this lesson his dad taught him. Pt and counselor continue to gain insight into this core belief associated w/ crying.  Suicidal/Homicidal: Nowithout intent/plan  Therapist Response: Counselor used open questions and CBT interventions to help pt gain insight  into his thought pattern, irrational beliefs, and core beliefs. Pt continues to show high degree of motivation and desire to gain insight into his thoughts and feelings.   Plan: Return again in 2 weeks.  Diagnosis:    ICD-10-CM   1. 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

## 2017-04-18 ENCOUNTER — Ambulatory Visit (INDEPENDENT_AMBULATORY_CARE_PROVIDER_SITE_OTHER): Payer: PRIVATE HEALTH INSURANCE | Admitting: Psychiatry

## 2017-04-18 ENCOUNTER — Encounter (HOSPITAL_COMMUNITY): Payer: Self-pay | Admitting: Psychiatry

## 2017-04-18 VITALS — BP 112/72 | HR 70 | Ht 74.0 in | Wt 168.6 lb

## 2017-04-18 DIAGNOSIS — Z87891 Personal history of nicotine dependence: Secondary | ICD-10-CM | POA: Diagnosis not present

## 2017-04-18 DIAGNOSIS — F3342 Major depressive disorder, recurrent, in full remission: Secondary | ICD-10-CM | POA: Insufficient documentation

## 2017-04-18 MED ORDER — SERTRALINE HCL 100 MG PO TABS
100.0000 mg | ORAL_TABLET | Freq: Every day | ORAL | 1 refills | Status: DC
Start: 2017-04-18 — End: 2017-10-10

## 2017-04-18 NOTE — Progress Notes (Signed)
BH MD/PA/NP OP Progress Note  04/18/2017 10:24 AM Daniel Harrington  MRN:  [id number]  Chief Complaint: med management  HPI: Daniel Harrington reports that his mood is stable, and he has been able to stay away from illicit substances. Things are going well with him and his partner.  He continues to work diligently in schooling. He denies any significant side effects other than some mild sexual dysfunction from Zoloft.  He continues to work in individual therapy and reports that his hope is to be able to be off of Zoloft in the coming 6 months. We agreed to follow up in 4 months and began tapering.  He is no longer taking Abilify for the past 2 months, denies any voices or paranoia. He is no longer requiring trazodone at night for sleep, and reports sleep is stable.   Visit Diagnosis:    ICD-10-CM   1. Recurrent major depressive disorder, in full remission (HCC) F33.42 sertraline (ZOLOFT) 100 MG tablet    Past Psychiatric History: See intake H&P for full details. Reviewed, with no updates at this time.  Past Medical History: History reviewed. No pertinent past medical history. History reviewed. No pertinent surgical history.  Family Psychiatric History: See intake H&P for full details. Reviewed, with no updates at this time.  Family History: History reviewed. No pertinent family history.  Social History:  Social History   Social History  . Marital status: Single    Spouse name: N/A  . Number of children: N/A  . Years of education: N/A   Social History Main Topics  . Smoking status: Former Smoker    Types: Cigarettes    Quit date: 09/01/2016  . Smokeless tobacco: Never Used  . Alcohol use 1.2 oz/week    2 Cans of beer per week  . Drug use: No     Comment: Last Marijuana 12/28/16 and no other use in over a year  . Sexual activity: No   Other Topics Concern  . None   Social History Narrative  . None    Allergies: No Known Allergies  Metabolic Disorder Labs: No results  found for: HGBA1C, MPG No results found for: PROLACTIN No results found for: CHOL, TRIG, HDL, CHOLHDL, VLDL, LDLCALC No results found for: TSH  Therapeutic Level Labs: No results found for: LITHIUM No results found for: VALPROATE No components found for:  CBMZ  Current Medications: Current Outpatient Prescriptions  Medication Sig Dispense Refill  . Multiple Vitamins-Minerals (MEGA MULTIVITAMIN FOR MEN PO) Take by mouth daily.    . Nutritional Supplements (CREATINE) 750 MG CAPS Take by mouth daily.    . Omega-3 Fatty Acids (FISH OIL PO) Take 1 capsule by mouth daily.    . sertraline (ZOLOFT) 100 MG tablet Take 1 tablet (100 mg total) by mouth daily. 90 tablet 1   No current facility-administered medications for this visit.      Musculoskeletal: Strength & Muscle Tone: within normal limits Gait & Station: normal Patient leans: N/A  Psychiatric Specialty Exam: ROS  Blood pressure 112/72, pulse 70, height  (1.88 m), weight 168 lb 9.6 oz (76.5 kg).Body mass index is 21.65 kg/m.  General Appearance: Casual and Fairly Groomed  Eye Contact:  Fair  Speech:  Clear and Coherent  Volume:  Normal  Mood:  Euthymic  Affect:  Congruent  Thought Process:  Goal Directed  Orientation:  Full (Time, Place, and Person)  Thought Content: Logical   Suicidal Thoughts:  No  Homicidal Thoughts:  No  Memory:  Immediate;   Fair  Judgement:  Fair  Insight:  Good  Psychomotor Activity:  Normal  Concentration:  Concentration: Good  Recall:  Good  Fund of Knowledge: Good  Language: Good  Akathisia:  Negative  Handed:  Right  AIMS (if indicated): not done  Assets:  Communication Skills Desire for Improvement Financial Resources/Insurance Housing Intimacy Leisure Time Physical Health Resilience Social Support Talents/Skills Transportation Vocational/Educational  ADL's:  Intact  Cognition: WNL  Sleep:  Good   Screenings: AIMS     [facility]  AIMS Total Score  0    AUDIT     [facility]  Alcohol Use Disorder Identification Test Final Score (AUDIT)  11      Assessment and Plan: Daniel Harrington presents today for medication management follow-up for depression. Symptoms appear to be in remission and he denies any ongoing substance abuse at this time. He continues to work with his individual therapist.  1. Recurrent major depressive disorder, in full remission (HCC)    Status of current problems: gradually improving  Labs Ordered: No orders of the defined types were placed in this encounter.  Labs Reviewed: n/a  Collateral Obtained/Records Reviewed: Reviewed individual therapy notes regarding ongoing care with [facility]   Plan:  Continue Zoloft 100 mg Discontinue Abilify and trazodone given no longer using Follow-up in 4 months and consider taper off of Zoloft  Burnard Leigh, MD 04/18/2017, 10:24 AM

## 2017-05-08 ENCOUNTER — Ambulatory Visit (INDEPENDENT_AMBULATORY_CARE_PROVIDER_SITE_OTHER): Payer: PRIVATE HEALTH INSURANCE | Admitting: Licensed Clinical Social Worker

## 2017-05-08 ENCOUNTER — Encounter (HOSPITAL_COMMUNITY): Payer: Self-pay | Admitting: Licensed Clinical Social Worker

## 2017-05-08 DIAGNOSIS — F3342 Major depressive disorder, recurrent, in full remission: Secondary | ICD-10-CM | POA: Diagnosis not present

## 2017-05-08 NOTE — Progress Notes (Signed)
   THERAPIST PROGRESS NOTE  Session Time: 10-11  Participation Level: Active  Behavioral Response: Neat and Well GroomedAlertEuthymic  Type of Therapy: Individual Therapy  Treatment Goals addressed: Diagnosis: MDD  Interventions: CBT, Strength-based and Supportive  Summary: Daniel Harrington is a [age] y.o. male who presents with MDD in full remission. Pt denies any SI or depressed thinking in the past 2 mo and states he is "feeling pushed by school, job, and [employer] duties; but not to the point of feeling depressed". He reports he has been having difficulties communicating w/ his girlfriend and planning their weekend plans..  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 Pt replied hesitantly and with some confusion stating, "I've not really thought about what kind of job I want after graduation". 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 Counselor and pt discussed medications and pt reported he is feeling stable on his meds.  Suicidal/Homicidal: Nowithout intent/plan  Therapist Response: Counselor used open questions and reflection to help pt grow his awareness and insight into his time management and depression management. Counselor used informal pros/cons lists and MI to help pt evoke his own reasons for change.  Plan: Return again in 4 weeks.  Diagnosis:    ICD-10-CM   1. Recurrent major depressive disorder, in full remission [facility](HCC) F33.42        Margo CommonWesley E Swan, LCAS-A 05/08/2017

## 2017-05-11 ENCOUNTER — Encounter (HOSPITAL_COMMUNITY): Payer: Self-pay | Admitting: Emergency Medicine

## 2017-05-11 ENCOUNTER — Emergency Department (HOSPITAL_COMMUNITY)
Admission: EM | Admit: 2017-05-11 | Discharge: 2017-05-11 | Disposition: A | Discharge status: Home / Self Care | Payer: No Typology Code available for payment source | Attending: Emergency Medicine | Admitting: Emergency Medicine

## 2017-05-11 ENCOUNTER — Other Ambulatory Visit: Payer: Self-pay

## 2017-05-11 DIAGNOSIS — F323 Major depressive disorder, single episode, severe with psychotic features: Secondary | ICD-10-CM | POA: Insufficient documentation

## 2017-05-11 DIAGNOSIS — Z8659 Personal history of other mental and behavioral disorders: Secondary | ICD-10-CM | POA: Insufficient documentation

## 2017-05-11 DIAGNOSIS — Z87891 Personal history of nicotine dependence: Secondary | ICD-10-CM | POA: Insufficient documentation

## 2017-05-11 DIAGNOSIS — Z79899 Other long term (current) drug therapy: Secondary | ICD-10-CM | POA: Insufficient documentation

## 2017-05-11 LAB — CBC
HEMATOCRIT: 44.6 % (ref 39.0–52.0)
HEMOGLOBIN: 14.4 g/dL (ref 13.0–17.0)
MCH: 27.2 pg (ref 26.0–34.0)
MCHC: 32.3 g/dL (ref 30.0–36.0)
MCV: 84.2 fL (ref 78.0–100.0)
PLATELETS: 203 10*3/uL (ref 150–400)
RBC: 5.3 MIL/uL (ref 4.22–5.81)
RDW: 15 % (ref 11.5–15.5)
WBC: 11 10*3/uL — ABNORMAL HIGH (ref 4.0–10.5)

## 2017-05-11 LAB — COMPREHENSIVE METABOLIC PANEL
ALBUMIN: 4.5 g/dL (ref 3.5–5.0)
ALT: 19 U/L (ref 17–63)
AST: 29 U/L (ref 15–41)
Alkaline Phosphatase: 60 U/L (ref 38–126)
Anion gap: 9 (ref 5–15)
BUN: 13 mg/dL (ref 6–20)
CHLORIDE: 102 mmol/L (ref 101–111)
CO2: 26 mmol/L (ref 22–32)
CREATININE: 1.15 mg/dL (ref 0.61–1.24)
Calcium: 9.4 mg/dL (ref 8.9–10.3)
GFR calc Af Amer: >60 mL/min (ref >60)
GLUCOSE: 100 mg/dL — AB (ref 65–99)
Potassium: 4.3 mmol/L (ref 3.5–5.1)
Sodium: 137 mmol/L (ref 135–145)
Total Bilirubin: 0.5 mg/dL (ref 0.3–1.2)
Total Protein: 8.2 g/dL — ABNORMAL HIGH (ref 6.5–8.1)

## 2017-05-11 LAB — ACETAMINOPHEN LEVEL: Acetaminophen (Tylenol), Serum: <10 ug/mL — ABNORMAL LOW (ref 10–30)

## 2017-05-11 LAB — SALICYLATE LEVEL: Salicylate Lvl: <7 mg/dL (ref 2.8–30.0)

## 2017-05-11 LAB — RAPID URINE DRUG SCREEN, HOSP PERFORMED
AMPHETAMINES: NOT DETECTED
BARBITURATES: NOT DETECTED
BENZODIAZEPINES: NOT DETECTED
Cocaine: NOT DETECTED
Opiates: NOT DETECTED
TETRAHYDROCANNABINOL: NOT DETECTED

## 2017-05-11 LAB — ETHANOL

## 2017-05-11 MED ORDER — SERTRALINE HCL 50 MG PO TABS: 100.0000 mg | ORAL_TABLET | Freq: Every day | ORAL | Status: DC

## 2017-05-11 NOTE — Progress Notes (Signed)
05/11/17 1355:  LRT introduced self to pt and offered activities, pt declined stating he had been sick with a cold a wanted to get some rest.   Caroll RancherMarjette Mitzie Marlar, LRT/CTRS

## 2017-05-11 NOTE — ED Triage Notes (Signed)
Pt verbalizes worsening auditory hallucinations over past year "telling me to hurt myself." Pt denies pain.

## 2017-05-11 NOTE — ED Notes (Signed)
Bed: [facility] Expected date:  Expected time:  Means of arrival:  Comments: Triage 1 

## 2017-05-11 NOTE — BH Assessment (Signed)
[facility]  Per Juanetta BeetsJacqueline Norman, DO, this pt does not require psychiatric hospitalization at this time.  [facility] [place].  This has been included in pt's discharge instructions.  Pt's nurse, Angelique BlonderDenise, has been notified.  Doylene Canninghomas Fallen Crisostomo, MA Triage Specialist [phone]

## 2017-05-11 NOTE — BH Assessment (Signed)
Assessment Note  Daniel Harrington is an 21 y.o. male who came to the ED after hearing voices this morning telling him to kill himself. Pt was tearful and states that he came here to get evaluated because his mom and dad were worried about him. Pt states that this is the "third" episode in a year and a half of him hearing voices. In June 2018 pt smoked marijuana that was possibly laced with LSD and heard voices telling him to kill himself. Pt took a knife and slit his throat and arm and had to get sutures. These scars are visible today. Pt states that he only used marijuana 2-3 times a month then and hasn't used anything since this episode. Pt UDS was negative. Pt states that the voices are telling him "it'll be ok if you kill yourself" and he is "hearing messages to kill himself in people's words".   Pt was oriented x4 and states that he sees Dorann Lodge and Dr. Rene Kocher at [facility] outpatient. He has been going there 2-3 times a month since July. Pt states that he just saw Wes on Monday and will have another appointment in early December. He states that he is only taking 100mg  of Zoloft at this time. Consent has been signed for staff to talk to [facility] OPT providers.   Pt denies HI and has no history of aggression. He is calm and cooperative in the ED.   Pt is recommended to inpatient treatment for safety and stabilization per Dr. Sharma Covert. EDP notified.   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 No pertinent past medical history.  History reviewed. No pertinent surgical history.  Family History: No family history on file.  Social History:  reports that he quit smoking about 8 months ago. His smoking use included cigarettes. he has never used smokeless tobacco. He reports that he drinks about 1.2 oz of alcohol per week. He reports that he does not use drugs.  Additional Social History:   Alcohol / Drug Use History of alcohol / drug use?: Yes Longest period of sobriety (when/how long): NA  Substance #1 Name of Substance 1: history of using marijuana   CIWA: CIWA-Ar BP: 131/90 Pulse Rate: 86 COWS:    Allergies: No Known Allergies  Home Medications:  (Not in a hospital admission)  OB/GYN Status:  No LMP for male patient.  General Assessment Data Location of Assessment: [facility] ED TTS Assessment: In system Is this a Tele or Face-to-Face Assessment?: Face-to-Face Is this an Initial Assessment or a Re-assessment for this encounter?: Initial Assessment Marital status: Single Is patient pregnant?: No Pregnancy Status: No Living Arrangements: Non-relatives/Friends Can pt return to current living arrangement?: Yes Admission Status: Voluntary Is patient capable of signing voluntary admission?: Yes Referral Source: Self/Family/Friend Insurance type: Med cost     Crisis Care Plan Living Arrangements: Non-relatives/Friends  Education Status Is patient currently in school?: Yes Current Grade: senior Highest grade of school patient has completed: senior in college Name of school: [employer]  Risk to self with the past 6 months Suicidal Ideation: Yes-Currently Present Has patient been a risk to self within the past 6 months prior to admission? : Yes Suicidal Intent: No Has patient had any suicidal intent within the past 6 months prior to admission? : Yes Is patient at risk for suicide?: Yes Suicidal Plan?: No Has patient had any suicidal plan within the past 6 months prior  to admission? : Yes(pt attempted suicide in June) Access to Means: Yes Specify Access to Suicidal Means: access to knives What has been your use of drugs/alcohol within the last 12 months?: denies current use Previous Attempts/Gestures: No How many times?: 0 Other Self Harm Risks: none Triggers for Past Attempts: None known Intentional Self Injurious Behavior: None Family Suicide History: No Recent  stressful life event(s): Other (Comment) Persecutory voices/beliefs?: Yes Depression: Yes Depression Symptoms: Despondent, Insomnia, Tearfulness, Loss of interest in usual pleasures, Feeling worthless/self pity Substance abuse history and/or treatment for substance abuse?: No Suicide prevention information given to non-admitted patients: Not applicable  Risk to Others within the past 6 months Homicidal Ideation: No Does patient have any lifetime risk of violence toward others beyond the six months prior to admission? : No Thoughts of Harm to Others: No Current Homicidal Intent: No Current Homicidal Plan: No Access to Homicidal Means: No Identified Victim: None History of harm to others?: No Assessment of Violence: None Noted Violent Behavior Description: no Does patient have access to weapons?: No Criminal Charges Pending?: No Does patient have a court date: No Is patient on probation?: No  Psychosis Hallucinations: Auditory Delusions: None noted  Mental Status Report Appearance/Hygiene: Disheveled Eye Contact: Fair Motor Activity: Freedom of movement Speech: Logical/coherent Level of Consciousness: Alert Mood: Depressed Affect: Depressed Anxiety Level: None Thought Processes: Coherent Judgement: Unimpaired Orientation: Person, Place, Time, Situation Obsessive Compulsive Thoughts/Behaviors: None  Cognitive Functioning Concentration: Normal Memory: Recent Intact, Remote Intact IQ: Average Insight: Poor Impulse Control: Fair Appetite: Poor Weight Loss: 0 Weight Gain: 0 Sleep: Decreased Total Hours of Sleep: 4 Vegetative Symptoms: None  ADLScreening [facility] Assessment Services) Patient's cognitive ability adequate to safely complete daily activities?: Yes Patient able to express need for assistance with ADLs?: Yes Independently performs ADLs?: Yes (appropriate for developmental age)  Prior Inpatient Therapy Prior Inpatient Therapy: Yes Prior Therapy Dates: June  2018 [facility] Reason for Treatment: SI, hearing voices, sub abuse  Prior Outpatient Therapy Prior Outpatient Therapy: Yes Prior Therapy Dates: ongoing [facility] Reason for Treatment: Depression Does patient have an ACCT team?: No Does patient have Intensive In-House Services?  : No Does patient have [facility] services? : No Does patient have P4CC services?: No  ADL Screening (condition at time of admission) Patient's cognitive ability adequate to safely complete daily activities?: Yes Is the patient deaf or have difficulty hearing?: No Does the patient have difficulty seeing, even when wearing glasses/contacts?: No Does the patient have difficulty concentrating, remembering, or making decisions?: No Patient able to express need for assistance with ADLs?: Yes Does the patient have difficulty dressing or bathing?: No Independently performs ADLs?: Yes (appropriate for developmental age) Does the patient have difficulty walking or climbing stairs?: No Weakness of Legs: None Weakness of Arms/Hands: None     Therapy Consults (therapy consults require a physician order) PT Evaluation Needed: No OT Evalulation Needed: No SLP Evaluation Needed: No   Values / Beliefs Cultural Requests During Hospitalization: None Spiritual Requests During Hospitalization: None Consults Spiritual Care Consult Needed: No Social Work Consult Needed: No Merchant navy officerAdvance Directives (For Healthcare) Does Patient Have a Medical Advance Directive?: No Would patient like information on creating a medical advance directive?: No - Patient declined    Additional Information 1:1 In Past 12 Months?: No CIRT Risk: No Elopement Risk: No Does patient have medical clearance?: Yes     Disposition:  Disposition Initial Assessment Completed for this Encounter: Yes Disposition of Patient: Inpatient treatment  program Type of inpatient treatment program:  Adult  On Site Evaluation by:  Dr. Sharma CovertNorman  Reviewed with Physician:    Lanice ShirtsKristin M Arun Herrod LPC, LCAS 05/11/2017 12:18 PM

## 2017-05-11 NOTE — BHH Suicide Risk Assessment (Signed)
Suicide Risk Assessment  Discharge Assessment   [facility]   Principal Problem: <principal problem not specified> Discharge Diagnoses:  Patient Active Problem List   Diagnosis Date Noted  . Recurrent major depressive disorder, in full remission (HCC) [F33.42] 04/18/2017  . Major depressive disorder, single episode, severe with psychotic features (HCC) [F32.3] 01/02/2017  . 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 [facility]. Pt's mother was present and will vouch for Pt's safety upon discharge. Pt is able to contract for safety upon discharge.  Discussed case with Roosvelt HarpsJackie Norman, MD who is in agreement that Pt can be discharged to follow up with his outpatient psychiatrist, Dr Rene KocherEksir.   Total Time spent with patient: 45 minutes  Musculoskeletal: Strength & Muscle Tone: within normal limits Gait & Station: normal Patient leans: N/A  Psychiatric Specialty Exam:   Blood pressure 131/90, pulse 86, temperature 98.4 F (36.9 C), temperature source Oral, resp. rate 18, SpO2 99 %.There is no height or weight on file to calculate BMI.  General Appearance: Casual  Eye Contact::  Good  Speech:  Clear and Coherent and Normal Rate409  Volume:  Normal  Mood:  Depressed  Affect:  Congruent and Depressed  Thought Process:  Coherent, Goal Directed and Linear  Orientation:  Full (Time, Place, and Person)  Thought Content:  Logical  Suicidal Thoughts:  No  Homicidal Thoughts:  No  Memory:  Immediate;   Good Recent;   Good Remote;   Fair  Judgement:  Good  Insight:  Good  Psychomotor Activity:  Normal  Concentration:  Good  Recall:  Good  Fund of Knowledge:Good  Language: Good  Akathisia:  No  Handed:  Right  AIMS (if indicated):     Assets:  Communication Skills Desire for Improvement Financial Resources/Insurance Housing Physical  Health Resilience Social Support Vocational/Educational  Sleep:     Cognition: WNL  ADL's:  Intact   Mental Status Per Nursing Assessment::   On Admission:     Demographic Factors:  Male and Adolescent or young adult  Loss Factors: Financial problems/change in socioeconomic status  Historical Factors: Impulsivity  Risk Reduction Factors:   Sense of responsibility to family and Living with another person, especially a relative  Continued Clinical Symptoms:  Depression:   Impulsivity  Cognitive Features That Contribute To Risk:  Closed-mindedness    Suicide Risk:  Minimal: No identifiable suicidal ideation.  Patients presenting with no risk factors but with morbid ruminations; may be classified as minimal risk based on the severity of the depressive symptoms    Plan Of Care/Follow-up recommendations:  Activity:  as tolerated Diet:  Heart Healthy  Laveda AbbeLaurie Britton Clevon Khader, NP 05/11/2017, 3:00 PM

## 2017-05-11 NOTE — ED Provider Notes (Signed)
[facility]-EMERGENCY DEPT Provider Note  CSN: [id number] Arrival date & time: 05/11/17 [id number]  Chief Complaint(s) Suicidal and Hallucinations  HPI Daniel Harrington is a 21 y.o. male patient with a history of major depressive disorders presents to the emergency department with command auditory hallucinations telling the patient "keep hurting yourself everything will be okay."  Patient reports that these were the same voices he heard when he tried to commit suicide in June.  Currently he denied any suicidal ideations, homicidal ideations.  He states that the voices only lasted for 30 minutes and have since resolved.  States that he is currently on Zoloft and has been compliant with his medications.  Denies any substance abuse.  HPI  Past Medical History History reviewed. No pertinent past medical history. Patient Active Problem List   Diagnosis Date Noted  . Recurrent major depressive disorder, in full remission (HCC) 04/18/2017  . Major depressive disorder, single episode, severe with psychotic features (HCC) 01/02/2017  . Hallucinogen abuse (HCC) 01/02/2017   Home Medication(s) Prior to Admission medications   Medication Sig Start Date End Date Taking? Authorizing Provider  DM-Phenylephrine-Acetaminophen (VICKS DAYQUIL COLD & FLU) 10-5-325 MG/15ML LIQD Take 30 mLs every 8 (eight) hours as needed by mouth (cold).   Yes [provider]  Multiple Vitamins-Minerals (MEGA MULTIVITAMIN FOR MEN PO) Take by mouth daily.   Yes [provider]  Omega-3 Fatty Acids (FISH OIL PO) Take 1 capsule by mouth daily.   Yes [provider]  Phenyleph-Doxylamine-DM-APAP (NYQUIL SEVERE COLD/FLU) 5-6.25-10-325 MG/15ML LIQD Take 30 mLs at bedtime as needed by mouth (cold).   Yes [provider]  sertraline (ZOLOFT) 100 MG tablet Take 1 tablet (100 mg total) by mouth daily. 04/18/17  Yes Eksir, Bo McclintockAlexander Arya, MD                                                                                       Past Surgical History History reviewed. No pertinent surgical history. Family History No family history on file.  Social History Social History   Tobacco Use  . Smoking status: Former Smoker    Types: Cigarettes    Last attempt to quit: 09/01/2016    Years since quitting: 0.6  . Smokeless tobacco: Never Used  Substance Use Topics  . Alcohol use: Yes    Alcohol/week: 1.2 oz    Types: 2 Cans of beer per week  . Drug use: No    Comment: Last Marijuana 12/28/16 and no other use in over a year   Allergies Patient has no known allergies.  Review of Systems Review of Systems All other systems are reviewed and are negative for acute change except as noted in the HPI  Physical Exam Vital Signs  I have reviewed the triage vital signs BP 131/90 (BP Location: Left Arm)   Pulse 86   Temp 98.4 F (36.9 C) (Oral)   Resp 18   SpO2 99%   Physical Exam  Constitutional: He is oriented to person, place, and time. He appears well-developed and well-nourished. No distress.  HENT:  Head: Normocephalic and atraumatic.  Nose: Nose normal.  Eyes: Conjunctivae and EOM  are normal. Pupils are equal, round, and reactive to light. Right eye exhibits no discharge. Left eye exhibits no discharge. No scleral icterus.  Neck: Normal range of motion. Neck supple.  Cardiovascular: Normal rate and regular rhythm. Exam reveals no gallop and no friction rub.  No murmur heard. Pulmonary/Chest: Effort normal and breath sounds normal. No stridor. No respiratory distress. He has no rales.  Abdominal: Soft. He exhibits no distension. There is no tenderness.  Musculoskeletal: He exhibits no edema or tenderness.  Neurological: He is alert and oriented to person, place, and time.  Skin: Skin is warm and dry. No rash noted. He is not diaphoretic. No erythema.  Remote laceration scars on left forearm and left side of the neck.  No new  injuries.  Psychiatric: He has a normal mood and affect.  Vitals reviewed.   ED Results and Treatments Labs (all labs ordered are listed, but only abnormal results are displayed) Labs Reviewed  CBC - Abnormal; Notable for the following components:      Result Value   WBC 11.0 (*)    All other components within normal limits  RAPID URINE DRUG SCREEN, HOSP PERFORMED  COMPREHENSIVE METABOLIC PANEL  ETHANOL  SALICYLATE LEVEL  ACETAMINOPHEN LEVEL                                                                                                                         EKG  EKG Interpretation  Date/Time:    Ventricular Rate:    PR Interval:    QRS Duration:   QT Interval:    QTC Calculation:   R Axis:     Text Interpretation:        Radiology No results found. Pertinent labs & imaging results that were available during my care of the patient were reviewed by me and considered in my medical decision making (see chart for details).  Medications Ordered in ED Medications - No data to display                                                                                                                                  Procedures Procedures  (including critical care time)  Medical Decision Making / ED Course I have reviewed the nursing notes for this encounter and the patient's prior records (if available in EHR or on provided paperwork).    Patient is high risk given recent history of suicide  attempt in the setting of command hallucinations.  Will obtain screening labs but I have low suspicion for organic cause of his presentation. I feel that patient is appropriate for evaluation by behavioral health while labs are pending.  Behavioral health recommended inpatient management.  Patient's home Zoloft was ordered.  Final Clinical Impression(s) / ED Diagnoses Final diagnoses:  Major depressive disorder, single episode, severe with psychotic features (HCC)  History of command  hallucinations      This chart was dictated using voice recognition software.  Despite best efforts to proofread,  errors can occur which can change the documentation meaning.   Nira Connardama, Taksh Hjort Eduardo, MD 05/11/17 1740

## 2017-05-11 NOTE — Discharge Instructions (Signed)
For your behavioral health needs, you are advised to continue treatment with Dr Rene KocherEksir and with Dorann LodgeWes Swan at [facility]:       [facility]      [address]      [place], [place] [address]      [phone]

## 2017-06-06 ENCOUNTER — Ambulatory Visit (INDEPENDENT_AMBULATORY_CARE_PROVIDER_SITE_OTHER): Payer: PRIVATE HEALTH INSURANCE | Admitting: Licensed Clinical Social Worker

## 2017-06-06 ENCOUNTER — Encounter (HOSPITAL_COMMUNITY): Payer: Self-pay | Admitting: Licensed Clinical Social Worker

## 2017-06-06 DIAGNOSIS — F323 Major depressive disorder, single episode, severe with psychotic features: Secondary | ICD-10-CM | POA: Diagnosis not present

## 2017-06-06 NOTE — Progress Notes (Signed)
   THERAPIST PROGRESS NOTE  Session Time: 9-10  Participation Level: Active  Behavioral Response: Casual, Fairly Groomed and NeatAlertEuthymic  Type of Therapy: Individual Therapy  Treatment Goals addressed: Anxiety and Coping  Interventions: CBT, Strength-based and Supportive  Summary: Daniel Harrington is a 21 y.o. male who presents with anxiety, panic symptoms, recent episodic delusions w/ SI. He states he has been "mostly good apart from having another episode 2 weeks ago". He reports he awoke from a nightmare shaking and went to his gf's house expressing symptoms of panic and "hearing voices again telling me I was going to have to kill myself". Pt and his gf then called his parents who drove down from [place] and helped pt admit himself to hospital for "a brain scan". Pt states by the time his mother arrived that day, "he felt back to normal". Pt states he feels less scared of his delusions and realizes his fear is more rooted in a general fear of death. 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 Pt states he has been hearing voices "like whispers" for many years that tell him he is special and/or relating songs/movies to his own experiences.    Pt denies any SI, low mood, or low self worth. He states he has been exercising less and increasingly stressed and focused on finishing college semester. Pt admits he was operating on about 5hours of sleep when he awoke w/ panic/delusions.   Suicidal/Homicidal: Nowithout intent/plan  Therapist Response: Counselor assesed pt's level of functioning per pt report. Counselor provided psychoeducation on pt's delusions and introduced pt to idea of the "spectrum of schizophrenia". Pt was agreable to this intervention and stated he "loves to learn more about his own MI". Pt was encouraged to discuss possible change in dx w/ Dr. Rene KocherEksir at his next appointment if  delusions/persecutions continue.  Plan: Return again in 2 weeks.  Diagnosis:    ICD-10-CM   1. 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

## 2017-07-07 ENCOUNTER — Ambulatory Visit (INDEPENDENT_AMBULATORY_CARE_PROVIDER_SITE_OTHER): Payer: No Typology Code available for payment source | Admitting: Licensed Clinical Social Worker

## 2017-07-07 ENCOUNTER — Encounter (HOSPITAL_COMMUNITY): Payer: Self-pay | Admitting: Licensed Clinical Social Worker

## 2017-07-07 DIAGNOSIS — F3342 Major depressive disorder, recurrent, in full remission: Secondary | ICD-10-CM

## 2017-07-07 NOTE — Progress Notes (Signed)
   THERAPIST PROGRESS NOTE  Session Time: 9-10  Participation Level: Active  Behavioral Response: Casual and NeatAlertEuthymic  Type of Therapy: Individual Therapy  Treatment Goals addressed: Diagnosis: MDD  Interventions: CBT, Strength-based and Supportive  Summary: Daniel Harrington is a 22 y.o. male who presents as active and engaged in session. He reports he continues to feel more positive, well equipped, and adjusted to living a life "where depression doesn't define me". Pt reports he spent last 2 weeks at his mothers house in [place], [place] and is enjoying the break from university. Pt and counselor discuss pt's intense dreams he's been having which are violent and making him "somewhat anxious to fall asleep". Counselor encouraged pt to explore ways he could be creative such as making videos, art, or dream journaling.   Pt and counselor discuss pt's goals/resolutions for 2019 including 1. Cut out red meat 2. go to gym at least 5times per week 3. Get a job after graduation in Spring.   Pt was upbeat and positive and stated he was looking forward to talking w/ Dr. Rene KocherEksir when he returns in February.   Suicidal/Homicidal: Nowithout intent/plan  Therapist Response: Counselor used affirmations, validation, and open qeustions to help pt verbalize his recent bx and thought changes which are contributing to his increased sense of mental and bx balance. Counselor assessed pt's level of functioning.  Plan: Return again in 4 weeks.  Diagnosis:    ICD-10-CM   1. Recurrent major depressive disorder, in full remission [facility](HCC) F33.42          Daniel Harrington, LCAS-A 07/07/2017

## 2017-08-07 ENCOUNTER — Ambulatory Visit (HOSPITAL_COMMUNITY): Payer: Self-pay | Admitting: Psychiatry

## 2017-08-15 ENCOUNTER — Ambulatory Visit (INDEPENDENT_AMBULATORY_CARE_PROVIDER_SITE_OTHER): Payer: No Typology Code available for payment source | Admitting: Licensed Clinical Social Worker

## 2017-08-15 DIAGNOSIS — F3342 Major depressive disorder, recurrent, in full remission: Secondary | ICD-10-CM | POA: Diagnosis not present

## 2017-08-16 ENCOUNTER — Ambulatory Visit (HOSPITAL_COMMUNITY): Payer: No Typology Code available for payment source | Admitting: Psychiatry

## 2017-08-16 ENCOUNTER — Encounter (HOSPITAL_COMMUNITY): Payer: Self-pay | Admitting: Licensed Clinical Social Worker

## 2017-08-16 NOTE — Progress Notes (Signed)
   THERAPIST PROGRESS NOTE  Session Time: 11-12  Participation Level: Active  Behavioral Response: Casual and NeatAlertEuthymic  Type of Therapy: Individual Therapy  Treatment Goals addressed: Anxiety  Interventions: CBT and Supportive  Summary: Daniel Harrington is a 22 y.o. male who presents with mild anxiety and hx of depression currently in remission. Medications are stable and being taken as px. No sxs of depression in past 2 weeks. Pt states he has "typical anxiety for a senior in college". Counselor and pt discuss pt's plan for upcoming job interviews, more schooling, MBA's, and/or paid internships. Pt reports he is feeling comfortable w/ his options.  Pt discusses his relationship to his girlfriend who is telling pt "he is not very available to her both physically and emotionally". Counselor and pt examine the evidence of this and determine that pt is intentionally scheduling his days to have a "final hour at 10pm" to spend w/ his gf and sees that this needs to change. Pt reports he and his gf are in talks to "be more spontaneous throughout the day and text each other w/ requests to hang out". Pt verbalizes insight into his instinct to "do rather than to just be" and admits he has a love/hate relationship to lists. Counselor spends time asking pt open questions to determine how being more "open to being present" could benefit him. Pt replies he "knows that there is more to life than accomplishing things and wants to learn how to have more enjoyment moment to moment". Counselor inquires about pt's use of mindfulness and pt replies he has abook on mindfulness that he has been meaning to utilize.    Suicidal/Homicidal: Nowithout intent/plan  Therapist Response:  gathered information, identified and discussed  stressors,  discuss the role of self-care in managing depression, assisted patient identify ways to improve self-care especially regarding his internal self talk, challenging negative  thinking, and making space to relax. Pt appears overworked, stressed, and feels insecure if he is not "accomplishing something".  Plan: Return again in 2 weeks.  Diagnosis:    ICD-10-CM   1. Recurrent major depressive disorder, in full remission [facility](HCC) F33.42        Margo CommonWesley E Swan, LCAS-A 08/16/2017

## 2017-08-31 ENCOUNTER — Ambulatory Visit (INDEPENDENT_AMBULATORY_CARE_PROVIDER_SITE_OTHER): Payer: No Typology Code available for payment source | Admitting: Licensed Clinical Social Worker

## 2017-08-31 DIAGNOSIS — F3342 Major depressive disorder, recurrent, in full remission: Secondary | ICD-10-CM | POA: Diagnosis not present

## 2017-09-05 ENCOUNTER — Encounter (HOSPITAL_COMMUNITY): Payer: Self-pay | Admitting: Licensed Clinical Social Worker

## 2017-09-05 NOTE — Progress Notes (Signed)
   THERAPIST PROGRESS NOTE  Session Time: 11-12  Participation Level: Active  Behavioral Response: Neat and Well GroomedAlertEuthymic  Type of Therapy: Individual Therapy  Treatment Goals addressed: Diagnosis: MDD  Interventions: CBT, Strength-based and Supportive  Summary: Daniel Harrington is a 22 y.o. male who presents with recent hx of MDD w/ psychotic features. He continues to deny depression and psychosis sxs. He continues to need help in planning for his future job experiences, resume building skills, and maintaining a healthy romantic relationship w/ his gf. He states he is focusing on getting a job in [place] so he can stay close to family and his gf. He discusses an ongoing theme and delusion he has that "somehow, he should hurt so that others will not suffer as much". This idea is highly theoretical though deeply ingrained in his psyche. He admits the idea's irrationality, and agrees to explore it as a Air cabin crewmetaphor in his life. Pt and counselor discuss ways that pt feels he "shouldn't succeed" because other's have so much suffering in their lives.  Suicidal/Homicidal: Nowithout intent/plan  Therapist Response: Counselor used supportive, open questions, counselor used goal setting and agenda setting from CBT.   Plan: Return again in 4 weeks.  Diagnosis:    ICD-10-CM   1. Recurrent major depressive disorder, in full remission [facility](HCC) F33.42      Margo CommonWesley E Jaythen Hamme, LCAS-A 09/05/2017

## 2017-09-21 ENCOUNTER — Ambulatory Visit (INDEPENDENT_AMBULATORY_CARE_PROVIDER_SITE_OTHER): Payer: No Typology Code available for payment source | Admitting: Licensed Clinical Social Worker

## 2017-09-21 DIAGNOSIS — F3342 Major depressive disorder, recurrent, in full remission: Secondary | ICD-10-CM | POA: Diagnosis not present

## 2017-09-21 NOTE — Progress Notes (Signed)
   THERAPIST PROGRESS NOTE  Session Time: 11-12  Participation Level: Active  Behavioral Response: Casual and NeatAlertEuthymic  Type of Therapy: Individual Therapy  Treatment Goals addressed: Coping  Interventions: CBT, Strength-based and Supportive  Summary: Daniel Harrington is a 22 y.o. male who presents with depression in remission. He states he has had at least 3 episodes of binge drinking in past 2 months and is feeling his paranoia "ramp up lately". Pt continues to take medications as px but feels that may be loosing effectiveness.  "I've been over thinking things and looking too much into the meaning of things lately". "I had a good [place] break [place] [employer] and [place]  Pt reports he declined to take a job offer in [place] working w/ his fraternity and traveling nationally bc he wants to stay near his girlfriend in [place]. Pt states they had a conversation and "they're pretty serious". [place] and is waiting to hear about an entry level job at  [employer] in [place].   Pt denies unmanageable stress or depression and states he feels he is under a "normal amount of stress for a college senior". Pt discusses his relationship to his parents and how he feels his parents want him to "move back in w/ them and take a break, but pt wants to break out and move somewhere on his own".      Suicidal/Homicidal: Nowithout intent/plan  Therapist Response: Counselor used open questions, reflection of content and emotion, encouragement, reframing, and challenging negative schemas.  Plan: Return again in 2 weeks.  Diagnosis:    ICD-10-CM   1. Recurrent major depressive disorder, in full remission [facility](HCC) F33.42        Daniel CommonWesley E Swan, LCAS-A 09/21/2017

## 2017-09-25 ENCOUNTER — Encounter (HOSPITAL_COMMUNITY): Payer: Self-pay | Admitting: Licensed Clinical Social Worker

## 2017-10-10 ENCOUNTER — Ambulatory Visit (INDEPENDENT_AMBULATORY_CARE_PROVIDER_SITE_OTHER): Payer: No Typology Code available for payment source | Admitting: Psychiatry

## 2017-10-10 ENCOUNTER — Encounter (HOSPITAL_COMMUNITY): Payer: Self-pay | Admitting: Psychiatry

## 2017-10-10 DIAGNOSIS — F3342 Major depressive disorder, recurrent, in full remission: Secondary | ICD-10-CM

## 2017-10-10 DIAGNOSIS — F1910 Other psychoactive substance abuse, uncomplicated: Secondary | ICD-10-CM | POA: Diagnosis not present

## 2017-10-10 DIAGNOSIS — Z87891 Personal history of nicotine dependence: Secondary | ICD-10-CM

## 2017-10-10 MED ORDER — SERTRALINE HCL 100 MG PO TABS: ORAL_TABLET | ORAL | 1 refills | Status: DC

## 2017-10-10 MED ORDER — ARIPIPRAZOLE 2 MG PO TABS: 2.0000 mg | ORAL_TABLET | Freq: Every day | ORAL | 1 refills | Status: DC

## 2017-10-10 NOTE — Progress Notes (Signed)
BH MD/PA/NP OP Progress Note  10/10/2017 9:09 AM Daniel Harrington  MRN:  [id number]  Chief Complaint: med check  HPI: Daniel LaurenceJulian L Harrington continues in individual therapy.  He was a no-show to our last appointment in February and canceled follow-up visit which was rescheduled.   He presents as moderately tearful, depressed, reports that he has had more depression and grief and sadness.  He reports worsening depression for the past 2 months, difficulty with energy, difficulty getting out of bed, difficulty meeting homework assignments and reports.  He is frequently tearful, has poor self-esteem.  Denies any thoughts to harm himself. he reports that things are going pretty well in his life and he does not know of any particular stressor that contributed.  He denies any thoughts to harm himself or others.  He has vague paranoia, but not related to others, more related to his health and physical self and wonders if he is going to be okay.  I spent time with him discussing an increase in Zoloft and restarting Abilify.  He did not have any intolerance with Abilify when he was previously on this, so we agreed to start the same 2 mg dose.  Visit Diagnosis:    ICD-10-CM   1. Recurrent major depressive disorder, in full remission (HCC) F33.42 sertraline (ZOLOFT) 100 MG tablet    ARIPiprazole (ABILIFY) 2 MG tablet    Past Psychiatric History: See intake H&P for full details. Reviewed, with no updates at this time.   Past Medical History: History reviewed. No pertinent past medical history. History reviewed. No pertinent surgical history.  Family Psychiatric History: See intake H&P for full details. Reviewed, with no updates at this time.   Family History: History reviewed. No pertinent family history.  Social History:  Social History   Socioeconomic History  . Marital status: Single    Spouse name: Not on file  . Number of children: Not on file  . Years of education: Not on file  . Highest  education level: Not on file  Occupational History  . Not on file  Social Needs  . Financial resource strain: Not on file  . Food insecurity:    Worry: Not on file    Inability: Not on file  . Transportation needs:    Medical: Not on file    Non-medical: Not on file  Tobacco Use  . Smoking status: Former Smoker    Types: Cigarettes    Last attempt to quit: 09/01/2016    Years since quitting: 1.1  . Smokeless tobacco: Never Used  Substance and Sexual Activity  . Alcohol use: Yes    Alcohol/week: 1.2 oz    Types: 2 Cans of beer per week  . Drug use: No    Types: Marijuana, LSD, Cocaine, Amphetamines    Comment: Last Marijuana 12/28/16 and no other use in over a year  . Sexual activity: Never  Lifestyle  . Physical activity:    Days per week: Not on file    Minutes per session: Not on file  . Stress: Not on file  Relationships  . Social connections:    Talks on phone: Not on file    Gets together: Not on file    Attends religious service: Not on file    Active member of club or organization: Not on file    Attends meetings of clubs or organizations: Not on file    Relationship status: Not on file  Other Topics Concern  . Not on file  Social History Narrative  . Not on file    Allergies: No Known Allergies  Metabolic Disorder Labs: No results found for: HGBA1C, MPG No results found for: PROLACTIN No results found for: CHOL, TRIG, HDL, CHOLHDL, VLDL, LDLCALC No results found for: TSH  Therapeutic Level Labs: No results found for: LITHIUM No results found for: VALPROATE No components found for:  CBMZ  Current Medications: Current Outpatient Medications  Medication Sig Dispense Refill  . Multiple Vitamins-Minerals (MEGA MULTIVITAMIN FOR MEN PO) Take by mouth daily.    . Omega-3 Fatty Acids (FISH OIL PO) Take 1 capsule by mouth daily.    . sertraline (ZOLOFT) 100 MG tablet Take 1.5 tablets (150 mg total) by mouth daily for 7 days, THEN 2 tablets (200 mg total)  daily. 180 tablet 1  . ARIPiprazole (ABILIFY) 2 MG tablet Take 1 tablet (2 mg total) by mouth daily. 90 tablet 1  . DM-Phenylephrine-Acetaminophen (VICKS DAYQUIL COLD & FLU) 10-5-325 MG/15ML LIQD Take 30 mLs every 8 (eight) hours as needed by mouth (cold).    . Phenyleph-Doxylamine-DM-APAP (NYQUIL SEVERE COLD/FLU) 5-6.25-10-325 MG/15ML LIQD Take 30 mLs at bedtime as needed by mouth (cold).     No current facility-administered medications for this visit.      Musculoskeletal: Strength & Muscle Tone: within normal limits Gait & Station: normal Patient leans: N/A  Psychiatric Specialty Exam: ROS  Blood pressure 115/66, pulse 89, height 6\' 2"  (1.88 m), weight 164 lb 9.6 oz (74.7 kg), SpO2 97 %.Body mass index is 21.13 kg/m.  General Appearance: Casual and Fairly Groomed  Eye Contact:  Good  Speech:  Clear and Coherent and Normal Rate  Volume:  Normal  Mood:  okay  Affect:  Congruent  Thought Process:  Goal Directed and Descriptions of Associations: Intact  Orientation:  Full (Time, Place, and Person)  Thought Content: Logical   Suicidal Thoughts:  No  Homicidal Thoughts:  No  Memory:  Immediate;   Fair  Judgement:  Fair  Insight:  Fair  Psychomotor Activity:  Normal  Concentration:  Concentration: Good  Recall:  Good  Fund of Knowledge: Good  Language: Good  Akathisia:  Negative  Handed:  Right  AIMS (if indicated): not done  Assets:  Communication Skills Desire for Improvement Financial Resources/Insurance Housing Social Support Transportation Vocational/Educational  ADL's:  Intact  Cognition: WNL  Sleep:  Good   Screenings: AIMS     [facility]  AIMS Total Score  0    AUDIT     [facility]  Alcohol Use Disorder Identification Test Final Score (AUDIT)  11       Assessment and Plan: Daniel Laurence presents  with worsening depressive symptoms in the context of increased work load at school, no other significant social or family stressors.  Denies any significant substance abuse, periodically drinks alcohol with his fraternity.  Presents with tearfulness, low energy, anhedonia, increased dream sleep, poor concentration.  We agreed to proceed as below and follow-up in 8 weeks.  He denies any suicidal thoughts or unsafe thoughts.  1. Recurrent major depressive disorder, in full remission (HCC)     Status of current problems: gradually worsening  Labs Ordered: No orders of the defined types were placed in this encounter.   Labs Reviewed: N/A  Collateral Obtained/Records Reviewed: Reviewed therapy notes  Plan:  Continue individual therapy, follow-up with writer in 8 weeks Increase Abilify  to 2 mg  increase Zoloft to 200 mg  I spent 25 minutes with the patient in direct face-to-face clinical care.  Greater than 50% of this time was spent in counseling and coordination of care with the patient.    Burnard Leigh, MD 10/10/2017, 9:09 AM

## 2017-10-25 ENCOUNTER — Encounter (HOSPITAL_COMMUNITY): Payer: Self-pay | Admitting: Licensed Clinical Social Worker

## 2017-10-25 ENCOUNTER — Ambulatory Visit (INDEPENDENT_AMBULATORY_CARE_PROVIDER_SITE_OTHER): Payer: No Typology Code available for payment source | Admitting: Licensed Clinical Social Worker

## 2017-10-25 DIAGNOSIS — F3342 Major depressive disorder, recurrent, in full remission: Secondary | ICD-10-CM | POA: Diagnosis not present

## 2017-10-25 NOTE — Progress Notes (Signed)
   THERAPIST PROGRESS NOTE  Session Time: 2-3  Participation Level: Active  Behavioral Response: Neat and Well GroomedAlertEuthymic  Type of Therapy: Individual Therapy  Treatment Goals addressed: Diagnosis: MDD  Interventions: CBT, Supportive and Reframing  Summary: Daniel Harrington is a 22 y.o. male who presents with MDD in partial remission. He states his weeks have been improving since his last appointment w/ Dr. Rene KocherEksir when he became very tearful and "let out a lot of emotion". Counselor asks pt for his instincts on why/how he became so emotional in that moment w/ Dr. Rene KocherEksir. Pt stated he did not know but he felt "a wall let down" when he sat in Dr. Unice BaileyEksir's office. Counselor reflected that perhaps pt was unconsciously putting up a wall w/ other people and in that moment he allowed the wall to come down. Pt agreed but admitted he was confused as to how he could feel something that was outside his awareness. Counselor reflected that often depression presents as irritation and anger which pt admitted was causing him problems lately.   Pt asked how he could work on becoming emotionally intimate w/ his girlfriend. COuneslor provided feedback on discussing feelings, using feeling words w/ gf, and letting his gf into his emotional state "no matter how uncomfortable it is". Pt agreed he wanted to try this.   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   Suicidal/Homicidal: Nowithout intent/plan  Therapist Response: Counselor used open questions, reflection of content and feelings, person-centered goal setting, and psychoeducation to help increase pt insight into his "emotional wall". Counselor recommended pt watch Brene Brown's TED on The Power Of Vulnerability w/ his gf and discuss their reactions together.  Plan: Return again in 2 weeks.  Diagnosis:    ICD-10-CM   1. Recurrent major  depressive disorder, in full remission [facility](HCC) F33.42       Margo CommonWesley E Swan, LCAS-A 10/25/2017

## 2017-11-23 ENCOUNTER — Ambulatory Visit (INDEPENDENT_AMBULATORY_CARE_PROVIDER_SITE_OTHER): Payer: No Typology Code available for payment source | Admitting: Licensed Clinical Social Worker

## 2017-11-23 DIAGNOSIS — F332 Major depressive disorder, recurrent severe without psychotic features: Secondary | ICD-10-CM

## 2017-11-30 ENCOUNTER — Encounter (HOSPITAL_COMMUNITY): Payer: Self-pay | Admitting: Licensed Clinical Social Worker

## 2017-11-30 NOTE — Progress Notes (Signed)
   THERAPIST PROGRESS NOTE  Session Time: 10-11  Participation Level: Active  Behavioral Response: Neat and Well GroomedAlertDepressed  Type of Therapy: Individual Therapy  Treatment Goals addressed: Coping  Interventions: CBT and Supportive  Summary: Daniel Harrington is a 21 y.o. male who presents with MDD. He states he enjoyed the homework assignment of watching Brene Brown's TED talk on vulnerability w/ his girlfriend. Upon viewing the video and discussing it w/ his gf, he began to cry and told her "he is so afraid of failing". Pt states he did not understand why he was crying or where it came from but "it felt good to let it out in front of her". Pt's gf was supportive.  Pt discussed his recent graduation from [employer] and how his family is very proud of him. Pt states he wishes "things were not so awkward w/ his mother" and counselor asked for more clarification. Pt states that he and his mother have "always been somewhat distant and he wishes he didn't get so irritated at her all the time". This led to discussion on pt's feelings about his parents divorce when pt was around 10yo. Pt states he blames his parents for "not trying hard enough to make the marriage work" though he "knows the divorce was for the best". Pt appears to have deep seated unresolved emotions regarding his parent's choices and their handling of the separation. Pt states he resents his mother for "talking shit about his dad in front of the kids". Counselor and pt discuss pt's immediate feeling state. Counselor invites pt to "sit w/ his emotions of anger or disappointment". Pt becomes slightly tearful and states he feels "weird". Counselor encourages pt to not judge himself for whatever he is feeling in this moment. Pt sits in silence for around 2-3 minutes, being mindful of his mood. Pt ends session stating he feels somewhat better and "relieved".   Suicidal/Homicidal: Nowithout intent/plan  Therapist Response: Counselor used  open questions, information gathering on family hx, emotional focusing technique. Counselor asked pt to write a letter to his mother w/o intention to send it. The letter should describe his feelings about his parent's divorce and his mother's treatment of him.  Plan: Return again in 2 weeks.  Diagnosis:    ICD-10-CM   1. Severe episode of recurrent major depressive disorder, without psychotic features [facility]) F33.2       Margo Common, LCAS-A 11/30/2017

## 2017-12-06 ENCOUNTER — Encounter (HOSPITAL_COMMUNITY): Payer: Self-pay | Admitting: Psychiatry

## 2017-12-06 ENCOUNTER — Ambulatory Visit (INDEPENDENT_AMBULATORY_CARE_PROVIDER_SITE_OTHER): Payer: No Typology Code available for payment source | Admitting: Psychiatry

## 2017-12-06 VITALS — BP 131/71 | HR 78 | Ht 73.0 in | Wt 167.0 lb

## 2017-12-06 DIAGNOSIS — F3342 Major depressive disorder, recurrent, in full remission: Secondary | ICD-10-CM | POA: Diagnosis not present

## 2017-12-06 DIAGNOSIS — F4323 Adjustment disorder with mixed anxiety and depressed mood: Secondary | ICD-10-CM | POA: Diagnosis not present

## 2017-12-06 DIAGNOSIS — Z87891 Personal history of nicotine dependence: Secondary | ICD-10-CM

## 2017-12-06 MED ORDER — ARIPIPRAZOLE 2 MG PO TABS: 2.0000 mg | ORAL_TABLET | Freq: Every day | ORAL | 1 refills | Status: AC

## 2017-12-06 MED ORDER — SERTRALINE HCL 100 MG PO TABS: 200.0000 mg | ORAL_TABLET | Freq: Every day | ORAL | 1 refills | Status: AC

## 2017-12-06 NOTE — Progress Notes (Signed)
BH MD/PA/NP OP Progress Note  12/06/2017 1:35 PM Daniel Harrington  MRN:  [id number]  Chief Complaint: med check  HPI: Daniel Harrington feels very stable in terms of his mood and depressive symptoms.  No psychotic symptoms.  Tolerating Abilify and Zoloft without any negative side effects.  He has graduated school and will be starting his new job in a few weeks.  This will involve a lot of travel to [place], [place], [place].  He will still live in [place] and wishes to continue in his psychiatric care with this provider.  Disclosed to patient that this Clinical research associate is leaving this practice at the end of August 2019, and patients always has the right to choose their provider. Reassured patient that office will work to provide smooth transition of care whether they wish to remain at this office, or to continue with this provider, or seek alternative care options in community.  They expressed understanding.  He denies any safety concerns, and agrees to follow-up in 4-6 months or sooner if needed.  He will continue individual therapy with Wes on a monthly basis.  I suggested to him that if it ever becomes too cumbersome for him to drive to [place], I be happy to assist him in finding a psychiatrist locally, and he would certainly also be able to get good resources from his insurance provider.  Visit Diagnosis:    ICD-10-CM   1. Adjustment disorder with mixed anxiety and depressed mood F43.23   2. Recurrent major depressive disorder, in full remission (HCC) F33.42 sertraline (ZOLOFT) 100 MG tablet    ARIPiprazole (ABILIFY) 2 MG tablet    Past Psychiatric History: See intake H&P for full details. Reviewed, with no updates at this time.   Past Medical History: History reviewed. No pertinent past medical history. History reviewed. No pertinent surgical history.  Family Psychiatric History: See intake H&P for full details. Reviewed, with no updates at this time.   Family  History: History reviewed. No pertinent family history.  Social History:  Social History   Socioeconomic History  . Marital status: Single    Spouse name: Not on file  . Number of children: Not on file  . Years of education: Not on file  . Highest education level: Not on file  Occupational History  . Not on file  Social Needs  . Financial resource strain: Not on file  . Food insecurity:    Worry: Not on file    Inability: Not on file  . Transportation needs:    Medical: Not on file    Non-medical: Not on file  Tobacco Use  . Smoking status: Former Smoker    Types: Cigarettes    Last attempt to quit: 09/01/2016    Years since quitting: 1.2  . Smokeless tobacco: Never Used  Substance and Sexual Activity  . Alcohol use: Yes    Comment: occasional  . Drug use: No    Types: Marijuana, LSD, Cocaine, Amphetamines    Comment: Last Marijuana 12/28/16 and no other use in over a year  . Sexual activity: Never  Lifestyle  . Physical activity:    Days per week: Not on file    Minutes per session: Not on file  . Stress: Not on file  Relationships  . Social connections:    Talks on phone: Not on file    Gets together: Not on file    Attends religious service: Not on file    Active  member of club or organization: Not on file    Attends meetings of clubs or organizations: Not on file    Relationship status: Not on file  Other Topics Concern  . Not on file  Social History Narrative  . Not on file    Allergies: No Known Allergies  Metabolic Disorder Labs: No results found for: HGBA1C, MPG No results found for: PROLACTIN No results found for: CHOL, TRIG, HDL, CHOLHDL, VLDL, LDLCALC No results found for: TSH  Therapeutic Level Labs: No results found for: LITHIUM No results found for: VALPROATE No components found for:  CBMZ  Current Medications: Current Outpatient Medications  Medication Sig Dispense Refill  . ARIPiprazole (ABILIFY) 2 MG tablet Take 1 tablet (2 mg total)  by mouth daily. 90 tablet 1  . Multiple Vitamins-Minerals (MEGA MULTIVITAMIN FOR MEN PO) Take by mouth daily.    . Nutritional Supplements (CREATINE) 750 MG CAPS Take by mouth.    . Omega-3 Fatty Acids (FISH OIL PO) Take 1 capsule by mouth daily.    . sertraline (ZOLOFT) 100 MG tablet Take 2 tablets (200 mg total) by mouth daily. 180 tablet 1   No current facility-administered medications for this visit.      Musculoskeletal: Strength & Muscle Tone: within normal limits Gait & Station: normal Patient leans: N/A  Psychiatric Specialty Exam: ROS  Blood pressure 131/71, pulse 78, height 6\' 1"  (1.854 m), weight 167 lb (75.8 kg).Body mass index is 22.03 kg/m.  General Appearance: Casual and Fairly Groomed  Eye Contact:  Good  Speech:  Clear and Coherent and Normal Rate  Volume:  Normal  Mood:  okay  Affect:  Congruent  Thought Process:  Goal Directed and Descriptions of Associations: Intact  Orientation:  Full (Time, Place, and Person)  Thought Content: Logical   Suicidal Thoughts:  No  Homicidal Thoughts:  No  Memory:  Immediate;   Fair  Judgement:  Fair  Insight:  Fair  Psychomotor Activity:  Normal  Concentration:  Concentration: Good  Recall:  Good  Fund of Knowledge: Good  Language: Good  Akathisia:  Negative  Handed:  Right  AIMS (if indicated): not done  Assets:  Communication Skills Desire for Improvement Financial Resources/Insurance Housing Social Support Transportation Vocational/Educational  ADL's:  Intact  Cognition: WNL  Sleep:  Good   Screenings: AIMS     [facility]  AIMS Total Score  0    AUDIT     [facility]  Alcohol Use Disorder Identification Test Final Score (AUDIT)  11       Assessment and Plan: Reather LaurenceJulian L Sofia presents with euthymic mood, recently graduated college, and is looking  forward to starting his new job this summer.  No abnormal movements on exam, and no side effects to report from Zoloft or Abilify. No acute safety concerns or psychotic symptoms.  We will follow-up in 4-6 months or sooner if needed.  1. Adjustment disorder with mixed anxiety and depressed mood   2. Recurrent major depressive disorder, in full remission (HCC)     Status of current problems: gradually worsening  Labs Ordered: No orders of the defined types were placed in this encounter.   Labs Reviewed: N/A  Collateral Obtained/Records Reviewed: Reviewed therapy notes  Plan:  Continue individual therapy, follow-up with writer in 4-6 months Abilify 2 mg  Zoloft 200 mg  I spent 25 minutes with the patient  in direct face-to-face clinical care.  Greater than 50% of this time was spent in counseling and coordination of care with the patient.    Burnard Leigh, MD 12/06/2017, 1:35 PM

## 2017-12-25 ENCOUNTER — Ambulatory Visit (INDEPENDENT_AMBULATORY_CARE_PROVIDER_SITE_OTHER): Payer: No Typology Code available for payment source | Admitting: Licensed Clinical Social Worker

## 2017-12-25 ENCOUNTER — Encounter (HOSPITAL_COMMUNITY): Payer: Self-pay | Admitting: Licensed Clinical Social Worker

## 2017-12-25 DIAGNOSIS — F3342 Major depressive disorder, recurrent, in full remission: Secondary | ICD-10-CM | POA: Diagnosis not present

## 2017-12-25 NOTE — Progress Notes (Signed)
   THERAPIST PROGRESS NOTE  Session Time: 11-12  Participation Level: Active  Behavioral Response: Neat and Well GroomedAlertEuthymic  Type of Therapy: Individual Therapy  Treatment Goals addressed: Diagnosis: MDD  Interventions: CBT, Strength-based and Supportive  Summary: Daniel Harrington is a 22 y.o. male who presents with hx of MDD w/ hallucinations. He reports he has been feeling mostly positive and denies any SI in recent weeks. He reports he has recently realized he has an intense reaction to sleep deprivation and notices differences in his mood and physical pain level when he gets less than 6hrs of sleep.  Pt has officially complete Undergraduate courses at [employer] and is now living w/ his mother and brothers in [place], [place] while decides on his next move later this summer. Counselor and pt spent time discussing pt's relationship w/ his girlfriend. Pt wants to work on providing his gf w/ more "words of affirmation" from [employer]. Counselor and pt discuss psychological blocks that prevent pt from offering affirming words. Pt admits he "fears that people w/ perceive him has insincere since he is such a light-hearted kind of guy". Pt reports improved relationship w/ his mother and states he is tolerating his irritability w/ her better. Pt had a minor panic attack upon waking up on morning and quickly told his girlfriend and was able to get through it "quicker than he ever has".    Pt reports on his plan to start his new job tomorrow and will fly to [place] for work.  Suicidal/Homicidal: Nowithout intent/plan  Therapist Response: Counselor used open questions, support feedback on [employer], and cognitive strategies for challenging himself to provide more words of affirmation to his gf. Counselor assessed pt's level of functioning and medication compliance which both appear positive and stable.  Plan: Return again in 6-8 weeks  Diagnosis:    ICD-10-CM   1. Recurrent major  depressive disorder, in full remission [facility](HCC) F33.42        Daniel Harrington, LCAS-A 12/25/2017

## 2018-02-13 ENCOUNTER — Ambulatory Visit (INDEPENDENT_AMBULATORY_CARE_PROVIDER_SITE_OTHER): Payer: No Typology Code available for payment source | Admitting: Licensed Clinical Social Worker

## 2018-02-13 DIAGNOSIS — F3342 Major depressive disorder, recurrent, in full remission: Secondary | ICD-10-CM

## 2018-02-20 ENCOUNTER — Encounter (HOSPITAL_COMMUNITY): Payer: Self-pay | Admitting: Licensed Clinical Social Worker

## 2018-02-20 NOTE — Progress Notes (Signed)
   THERAPIST PROGRESS NOTE  Session Time: 10-11  Participation Level: Active  Behavioral Response: Neat and Well GroomedAlertUpbeat  Type of Therapy: Individual Therapy  Treatment Goals addressed: Diagnosis: MDD  Interventions: Strength-based and Supportive  Summary: Daniel LaurenceJulian L Harrington is a 22 y.o. male who presents with hx of MDD w/ psychosis, currently in remission.  Subjective: "I'm doing really well and adjusting to life after college well... I love my new job but it's causing some stress w/ my girlfriend".  Pt was engaged, positive, and smiling during session. He made strong eye contact and vocalized loudly. He stated he is currently arguing w/ girlfriend about whether or not he will be taking a full-time job in [place] in 2020.   Suicidal/Homicidal: Nowithout intent/plan  Therapist Response: Patient appears to be sustaining changes in his routine that contribute to positive affect and cognitions. He reports increased priority in sleep. Pt appears to continue to need help w/ communicating his feelings to his S/O.   Plan: Return again in 8 weeks for f/u session.  Diagnosis:    ICD-10-CM   1. Recurrent major depressive disorder, in full remission [facility](HCC) F33.42        Margo CommonWesley E Swan, LCAS-A 02/20/2018

## 2018-04-10 ENCOUNTER — Ambulatory Visit (INDEPENDENT_AMBULATORY_CARE_PROVIDER_SITE_OTHER): Payer: No Typology Code available for payment source | Admitting: Licensed Clinical Social Worker

## 2018-04-10 ENCOUNTER — Encounter (HOSPITAL_COMMUNITY): Payer: Self-pay | Admitting: Licensed Clinical Social Worker

## 2018-04-10 DIAGNOSIS — F331 Major depressive disorder, recurrent, moderate: Secondary | ICD-10-CM | POA: Diagnosis not present

## 2018-04-10 NOTE — Progress Notes (Signed)
   THERAPIST PROGRESS NOTE  Session Time: 4-5  Participation Level: Active  Behavioral Response: Casual and Well GroomedAlertDepressed and tearful  Type of Therapy: Individual Therapy  Treatment Goals addressed: Diagnosis: MDD  Interventions: CBT  Summary: Daniel Harrington is a 22 y.o. male who presents with MDD.  "I don't know how to ask for what I need in relationships. I want to work on being ok w/ being uncomfortable and comfortable emotions."  Pt is active, engaged, tearful, and makes good eye contact in session. 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 He admits it is hard to not have alone time when he works. He states he has not been to gym or read books more than once in past month which is unusual for him. Counselor and pt discuss pt's recent emotional reaction to being criticized by his S/O. Pt admits when he feels criticized that he feels inadequate and becomes defensive. Counselor and pt discuss psychological concept of "self compassion" and the importance of asking for help.  Suicidal/Homicidal: Nowithout intent/plan  Therapist Response: Counselor used open questions, supportive feedback, and emotional processing techniques. Counselor asked pt about his somatic experience of tearfulness. Counselor asked pt "what would he like to express to his girlfriend that he feels he can't say?". Pt became tearful and stated he "just feels so grateful for his gf." Counselor reflected that gratitude is a powerful emotion and that pt seems to interpret tears as "weakness". Pt agreed that as an boy he was praised for "not having to ask for help" by his parents.   Plan: Return again in 8 weeks.  Diagnosis:    ICD-10-CM   1. Moderate episode of recurrent major depressive disorder [facility]) F33.1       Margo Common, LCAS-A 04/10/2018

## 2018-06-05 ENCOUNTER — Ambulatory Visit (INDEPENDENT_AMBULATORY_CARE_PROVIDER_SITE_OTHER): Payer: No Typology Code available for payment source | Admitting: Licensed Clinical Social Worker

## 2018-06-05 DIAGNOSIS — F3342 Major depressive disorder, recurrent, in full remission: Secondary | ICD-10-CM

## 2018-06-12 ENCOUNTER — Encounter (HOSPITAL_COMMUNITY): Payer: Self-pay | Admitting: Licensed Clinical Social Worker

## 2018-06-12 NOTE — Progress Notes (Signed)
   THERAPIST PROGRESS NOTE  Session Time: 4-5  Participation Level: Active  Behavioral Response: Well GroomedAlertEuthymic  Type of Therapy: Individual Therapy  Treatment Goals addressed: Anxiety  Interventions: CBT  Summary: Daniel Harrington is a 22 y.o. male who presents with hx of depression w/ auditory hallucinations.   He is active, engaged, and makes strong eye contact. He reports he continues to find fulfillment in his current job though he admits he is wondering what he wants to do w/ rest of his life. Pt denies any SI or hallucinations. He reports on healthy conflicts he is having w/ his gf who is struggling w/ long distance relationship.    Suicidal/Homicidal: Nowithout intent/plan  Therapist Response: Counselor used open questions, active listening, and supportive reframing. Counselor taught cognitive challenging strategies and assertive communication. Counselor discussed boundary setting w/ pt and his gf.   Plan: Return again in 8 weeks.  Diagnosis:    ICD-10-CM   1. Recurrent major depressive disorder, in full remission [facility](HCC) F33.42       Margo CommonWesley E Swan, LCAS-A 06/12/2018

## 2018-08-07 ENCOUNTER — Ambulatory Visit (HOSPITAL_COMMUNITY): Payer: No Typology Code available for payment source | Admitting: Licensed Clinical Social Worker

## 2018-09-13 ENCOUNTER — Other Ambulatory Visit: Payer: Self-pay

## 2018-09-13 ENCOUNTER — Ambulatory Visit (INDEPENDENT_AMBULATORY_CARE_PROVIDER_SITE_OTHER): Payer: No Typology Code available for payment source | Admitting: Licensed Clinical Social Worker

## 2018-09-13 ENCOUNTER — Encounter (HOSPITAL_COMMUNITY): Payer: Self-pay | Admitting: Licensed Clinical Social Worker

## 2018-09-13 DIAGNOSIS — F3342 Major depressive disorder, recurrent, in full remission: Secondary | ICD-10-CM
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
## 2018-11-27 ENCOUNTER — Ambulatory Visit (HOSPITAL_COMMUNITY): Payer: No Typology Code available for payment source | Admitting: Licensed Clinical Social Worker
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
